# Patient Record
Sex: Male | Born: 1955 | Race: White | Hispanic: No | Marital: Single | Smoking: Former smoker
Health system: Southern US, Community
[De-identification: ages and names within clinical notes are randomized; demographics above are authoritative.]

## PROBLEM LIST (undated history)

## (undated) DIAGNOSIS — K219 Gastro-esophageal reflux disease without esophagitis: Secondary | ICD-10-CM

## (undated) DIAGNOSIS — I10 Essential (primary) hypertension: Secondary | ICD-10-CM

## (undated) DIAGNOSIS — N189 Chronic kidney disease, unspecified: Secondary | ICD-10-CM

## (undated) DIAGNOSIS — D649 Anemia, unspecified: Secondary | ICD-10-CM

## (undated) DIAGNOSIS — M1990 Unspecified osteoarthritis, unspecified site: Secondary | ICD-10-CM

## (undated) HISTORY — DX: Gastro-esophageal reflux disease without esophagitis: K21.9

## (undated) HISTORY — DX: Chronic kidney disease, unspecified: N18.9

## (undated) HISTORY — DX: Anemia, unspecified: D64.9

## (undated) HISTORY — DX: Unspecified osteoarthritis, unspecified site: M19.90

---

## 1975-09-23 HISTORY — PX: HERNIA REPAIR: SHX51

## 2000-08-05 ENCOUNTER — Encounter (INDEPENDENT_AMBULATORY_CARE_PROVIDER_SITE_OTHER): Payer: Self-pay | Admitting: Specialist

## 2000-08-05 ENCOUNTER — Other Ambulatory Visit: Admission: RE | Admit: 2000-08-05 | Discharge: 2000-08-05 | Payer: Self-pay | Admitting: Internal Medicine

## 2005-09-22 HISTORY — PX: KNEE ARTHROSCOPY: SUR90

## 2005-12-05 ENCOUNTER — Ambulatory Visit: Payer: Self-pay | Admitting: Internal Medicine

## 2007-03-23 ENCOUNTER — Ambulatory Visit: Payer: Self-pay | Admitting: Sports Medicine

## 2007-03-23 DIAGNOSIS — S83419A Sprain of medial collateral ligament of unspecified knee, initial encounter: Secondary | ICD-10-CM | POA: Insufficient documentation

## 2007-03-23 DIAGNOSIS — M6250 Muscle wasting and atrophy, not elsewhere classified, unspecified site: Secondary | ICD-10-CM | POA: Insufficient documentation

## 2007-03-23 HISTORY — DX: Sprain of medial collateral ligament of unspecified knee, initial encounter: S83.419A

## 2007-04-05 ENCOUNTER — Telehealth: Payer: Self-pay | Admitting: *Deleted

## 2007-04-05 ENCOUNTER — Ambulatory Visit: Payer: Self-pay | Admitting: Family Medicine

## 2007-04-28 ENCOUNTER — Ambulatory Visit: Payer: Self-pay | Admitting: Family Medicine

## 2007-04-28 ENCOUNTER — Encounter (INDEPENDENT_AMBULATORY_CARE_PROVIDER_SITE_OTHER): Payer: Self-pay | Admitting: Family Medicine

## 2007-04-28 DIAGNOSIS — K219 Gastro-esophageal reflux disease without esophagitis: Secondary | ICD-10-CM

## 2007-04-28 DIAGNOSIS — I10 Essential (primary) hypertension: Secondary | ICD-10-CM

## 2007-04-28 LAB — CONVERTED CEMR LAB
AST: 18 units/L (ref 0–37)
Albumin: 4.5 g/dL (ref 3.5–5.2)
Alkaline Phosphatase: 75 units/L (ref 39–117)
LDL Cholesterol: 107 mg/dL — ABNORMAL HIGH (ref 0–99)
Potassium: 4.4 meq/L (ref 3.5–5.3)
Sodium: 141 meq/L (ref 135–145)
TSH: 2.305 microintl units/mL (ref 0.350–5.50)
Total Bilirubin: 0.4 mg/dL (ref 0.3–1.2)
Total Protein: 6.9 g/dL (ref 6.0–8.3)
VLDL: 20 mg/dL (ref 0–40)

## 2007-04-29 ENCOUNTER — Encounter (INDEPENDENT_AMBULATORY_CARE_PROVIDER_SITE_OTHER): Payer: Self-pay | Admitting: Family Medicine

## 2007-05-03 ENCOUNTER — Telehealth: Payer: Self-pay | Admitting: *Deleted

## 2007-05-04 ENCOUNTER — Ambulatory Visit: Payer: Self-pay | Admitting: Sports Medicine

## 2007-05-18 ENCOUNTER — Encounter (INDEPENDENT_AMBULATORY_CARE_PROVIDER_SITE_OTHER): Payer: Self-pay | Admitting: Family Medicine

## 2007-05-26 DIAGNOSIS — K319 Disease of stomach and duodenum, unspecified: Secondary | ICD-10-CM | POA: Insufficient documentation

## 2007-05-26 DIAGNOSIS — K5730 Diverticulosis of large intestine without perforation or abscess without bleeding: Secondary | ICD-10-CM | POA: Insufficient documentation

## 2007-05-26 DIAGNOSIS — D126 Benign neoplasm of colon, unspecified: Secondary | ICD-10-CM

## 2007-08-05 ENCOUNTER — Encounter (INDEPENDENT_AMBULATORY_CARE_PROVIDER_SITE_OTHER): Payer: Self-pay | Admitting: Family Medicine

## 2007-08-05 LAB — CONVERTED CEMR LAB
HDL goal, serum: 40 mg/dL
LDL Goal: 130 mg/dL

## 2013-01-21 ENCOUNTER — Ambulatory Visit: Payer: Self-pay | Admitting: Internal Medicine

## 2015-01-01 ENCOUNTER — Ambulatory Visit
Admit: 2015-01-01 | Disposition: A | Discharge status: Home / Self Care | Payer: Self-pay | Attending: Internal Medicine | Admitting: Internal Medicine

## 2015-08-19 ENCOUNTER — Encounter: Payer: Self-pay | Admitting: Emergency Medicine

## 2015-08-19 ENCOUNTER — Emergency Department
Admission: EM | Admit: 2015-08-19 | Discharge: 2015-08-19 | Disposition: A | Discharge status: Home / Self Care | Payer: Self-pay | Attending: Emergency Medicine | Admitting: Emergency Medicine

## 2015-08-19 ENCOUNTER — Emergency Department: Payer: Self-pay

## 2015-08-19 DIAGNOSIS — K2970 Gastritis, unspecified, without bleeding: Secondary | ICD-10-CM | POA: Insufficient documentation

## 2015-08-19 DIAGNOSIS — R000 Tachycardia, unspecified: Secondary | ICD-10-CM | POA: Insufficient documentation

## 2015-08-19 DIAGNOSIS — K219 Gastro-esophageal reflux disease without esophagitis: Secondary | ICD-10-CM | POA: Insufficient documentation

## 2015-08-19 DIAGNOSIS — I10 Essential (primary) hypertension: Secondary | ICD-10-CM | POA: Insufficient documentation

## 2015-08-19 DIAGNOSIS — F419 Anxiety disorder, unspecified: Secondary | ICD-10-CM | POA: Insufficient documentation

## 2015-08-19 DIAGNOSIS — R1010 Upper abdominal pain, unspecified: Secondary | ICD-10-CM

## 2015-08-19 HISTORY — DX: Essential (primary) hypertension: I10

## 2015-08-19 LAB — CBC WITH DIFFERENTIAL/PLATELET
BASOS PCT: 0 %
Basophils Absolute: 0 10*3/uL (ref 0–0.1)
Eosinophils Absolute: 0 10*3/uL (ref 0–0.7)
Eosinophils Relative: 0 %
HEMATOCRIT: 49.6 % (ref 40.0–52.0)
Hemoglobin: 16.3 g/dL (ref 13.0–18.0)
Lymphocytes Relative: 11 %
Lymphs Abs: 1.1 10*3/uL (ref 1.0–3.6)
MCH: 29.2 pg (ref 26.0–34.0)
MCHC: 33 g/dL (ref 32.0–36.0)
MCV: 88.7 fL (ref 80.0–100.0)
MONO ABS: 0.8 10*3/uL (ref 0.2–1.0)
MONOS PCT: 8 %
NEUTROS ABS: 7.6 10*3/uL — AB (ref 1.4–6.5)
Neutrophils Relative %: 81 %
Platelets: 272 10*3/uL (ref 150–440)
RBC: 5.59 MIL/uL (ref 4.40–5.90)
RDW: 13.7 % (ref 11.5–14.5)
WBC: 9.4 10*3/uL (ref 3.8–10.6)

## 2015-08-19 LAB — URINALYSIS COMPLETE WITH MICROSCOPIC (ARMC ONLY)
BACTERIA UA: NONE SEEN
Bilirubin Urine: NEGATIVE
Glucose, UA: NEGATIVE mg/dL
Hgb urine dipstick: NEGATIVE
KETONES UR: NEGATIVE mg/dL
LEUKOCYTES UA: NEGATIVE
NITRITE: NEGATIVE
PH: 6 (ref 5.0–8.0)
PROTEIN: NEGATIVE mg/dL
RBC / HPF: NONE SEEN RBC/hpf (ref 0–5)
SPECIFIC GRAVITY, URINE: 1.021 (ref 1.005–1.030)
SQUAMOUS EPITHELIAL / LPF: NONE SEEN

## 2015-08-19 LAB — LIPASE, BLOOD: LIPASE: 22 U/L (ref 11–51)

## 2015-08-19 LAB — COMPREHENSIVE METABOLIC PANEL
ALT: 26 U/L (ref 17–63)
ANION GAP: 8 (ref 5–15)
AST: 26 U/L (ref 15–41)
Albumin: 4.6 g/dL (ref 3.5–5.0)
Alkaline Phosphatase: 60 U/L (ref 38–126)
BUN: 19 mg/dL (ref 6–20)
CHLORIDE: 103 mmol/L (ref 101–111)
CO2: 27 mmol/L (ref 22–32)
Calcium: 9.4 mg/dL (ref 8.9–10.3)
Creatinine, Ser: 0.8 mg/dL (ref 0.61–1.24)
Glucose, Bld: 114 mg/dL — ABNORMAL HIGH (ref 65–99)
POTASSIUM: 3.5 mmol/L (ref 3.5–5.1)
Sodium: 138 mmol/L (ref 135–145)
Total Bilirubin: 0.8 mg/dL (ref 0.3–1.2)
Total Protein: 7.8 g/dL (ref 6.5–8.1)

## 2015-08-19 LAB — TROPONIN I

## 2015-08-19 MED ORDER — MORPHINE SULFATE (PF) 4 MG/ML IV SOLN
4.0000 mg | Freq: Once | INTRAVENOUS | Status: AC
  Administered 2015-08-19: 4 mg via INTRAVENOUS

## 2015-08-19 MED ORDER — MORPHINE SULFATE (PF) 4 MG/ML IV SOLN
4.0000 mg | Freq: Once | INTRAVENOUS | Status: AC
  Administered 2015-08-19: 4 mg via INTRAVENOUS
  Filled 2015-08-19: qty 1

## 2015-08-19 MED ORDER — FAMOTIDINE 20 MG PO TABS
40.0000 mg | ORAL_TABLET | Freq: Once | ORAL | Status: AC
  Administered 2015-08-19: 40 mg via ORAL
  Filled 2015-08-19: qty 2

## 2015-08-19 MED ORDER — METOCLOPRAMIDE HCL 10 MG PO TABS: 10.0000 mg | ORAL_TABLET | Freq: Three times a day (TID) | ORAL | Status: DC

## 2015-08-19 MED ORDER — DIPHENHYDRAMINE HCL 50 MG/ML IJ SOLN
25.0000 mg | Freq: Once | INTRAMUSCULAR | Status: AC
  Administered 2015-08-19: 25 mg via INTRAVENOUS
  Filled 2015-08-19: qty 1

## 2015-08-19 MED ORDER — ONDANSETRON HCL 4 MG/2ML IJ SOLN
4.0000 mg | Freq: Once | INTRAMUSCULAR | Status: AC
  Administered 2015-08-19: 4 mg via INTRAVENOUS

## 2015-08-19 MED ORDER — GI COCKTAIL ~~LOC~~
30.0000 mL | ORAL | Status: AC
  Administered 2015-08-19: 30 mL via ORAL
  Filled 2015-08-19: qty 30

## 2015-08-19 MED ORDER — IOHEXOL 240 MG/ML SOLN
25.0000 mL | Freq: Once | INTRAMUSCULAR | Status: AC | PRN
  Administered 2015-08-19: 25 mL via ORAL

## 2015-08-19 MED ORDER — IOHEXOL 350 MG/ML SOLN
100.0000 mL | Freq: Once | INTRAVENOUS | Status: AC | PRN
  Administered 2015-08-19: 100 mL via INTRAVENOUS

## 2015-08-19 MED ORDER — MORPHINE SULFATE (PF) 4 MG/ML IV SOLN
INTRAVENOUS | Status: AC
  Administered 2015-08-19: 4 mg via INTRAVENOUS
  Filled 2015-08-19: qty 1

## 2015-08-19 MED ORDER — ONDANSETRON HCL 4 MG/2ML IJ SOLN
INTRAMUSCULAR | Status: AC
  Administered 2015-08-19: 4 mg via INTRAVENOUS
  Filled 2015-08-19: qty 2

## 2015-08-19 MED ORDER — METOCLOPRAMIDE HCL 5 MG/ML IJ SOLN
10.0000 mg | Freq: Once | INTRAMUSCULAR | Status: AC
  Administered 2015-08-19: 10 mg via INTRAVENOUS
  Filled 2015-08-19: qty 2

## 2015-08-19 MED ORDER — DIPHENHYDRAMINE HCL 25 MG PO CAPS: 50.0000 mg | ORAL_CAPSULE | Freq: Four times a day (QID) | ORAL | Status: DC | PRN

## 2015-08-19 MED ORDER — PB-HYOSCY-ATROPINE-SCOPOLAMINE 16.2 MG PO TABS: 1.0000 | ORAL_TABLET | ORAL | Status: DC | PRN

## 2015-08-19 MED ORDER — ONDANSETRON HCL 4 MG/2ML IJ SOLN
4.0000 mg | Freq: Once | INTRAMUSCULAR | Status: AC
Start: 2015-08-19 — End: 2015-08-19
  Administered 2015-08-19: 4 mg via INTRAVENOUS
  Filled 2015-08-19: qty 2

## 2015-08-19 MED ORDER — ONDANSETRON HCL 4 MG/2ML IJ SOLN
4.0000 mg | Freq: Once | INTRAMUSCULAR | Status: AC
  Administered 2015-08-19: 4 mg via INTRAVENOUS
  Filled 2015-08-19: qty 2

## 2015-08-19 MED ORDER — RANITIDINE HCL 150 MG PO CAPS: 300.0000 mg | ORAL_CAPSULE | Freq: Two times a day (BID) | ORAL | Status: DC

## 2015-08-19 MED ORDER — SUCRALFATE 1 G PO TABS: 1.0000 g | ORAL_TABLET | Freq: Four times a day (QID) | ORAL | Status: DC

## 2015-08-19 NOTE — ED Provider Notes (Signed)
[facility] Emergency Department Provider Note  ____________________________________________  Time seen: 1:30 PM  I have reviewed the triage vital signs and the nursing notes.   HISTORY  Chief Complaint Abdominal Pain    HPI Keith Ramos is a 59 y.o. male who presents with complaints of nausea vomiting and abdominal pain for approximately 3 days. He thought it was related to eating a heavy meal for Thanksgiving but the symptoms have not improved. He reports he had similar symptoms about a month ago after heavy meal. He reports the pain is essentially diffuse in his abdomen but slightly worse in the epigastrium. He denies chest pain. No fevers chills. No shortness of breath. No history of abdominal surgery besides a hernia repair. Eating makes the pain much worse. The pain is crampy and moderate in nature     History reviewed. No pertinent past medical history.  Patient Active Problem List   Diagnosis Date Noted  . COLONIC POLYPS 05/26/2007  . PEPTIC STRICTURE 05/26/2007  . DIVERTICULOSIS, COLON W/O HEM 05/26/2007  . HYPERTENSION, BENIGN ESSENTIAL, LABILE 04/28/2007  . REFLUX, ESOPHAGEAL 04/28/2007  . 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 KNEE SPRAIN, MEDIAL COLLATERAL LIGAMENT 03/23/2007    Past Surgical History  Procedure Laterality Date  . Hernia repair      No current outpatient prescriptions on file.  Allergies Review of patient's allergies indicates no known allergies.  No family history on file.  Social History Social History  Substance Use Topics  . Smoking status: Never Smoker   . Smokeless tobacco: None  . Alcohol Use: Yes    Review of Systems  Constitutional: Negative for fever. Eyes: Negative for visual changes. ENT: Negative for sore throat Cardiovascular: Negative for chest pain. Respiratory: Negative for shortness of breath. Gastrointestinal: Positive for abdominal pain, nausea vomiting, no diarrhea Genitourinary:  Negative for dysuria. Musculoskeletal: Negative for back pain. Skin: Negative for rash. Neurological: Negative for headaches or focal weakness Psychiatric: Mild anxiety  ____________________________________________   PHYSICAL EXAM:  VITAL SIGNS: ED Triage Vitals  Enc Vitals Group     BP 08/19/15 1256 195/103 mmHg     Pulse Rate 08/19/15 1256 105     Resp 08/19/15 1256 20     Temp 08/19/15 1256 98.5 F (36.9 C)     Temp Source 08/19/15 1256 Oral     SpO2 08/19/15 1256 99 %     Weight 08/19/15 1256 158 lb (71.668 kg)     Height 08/19/15 1256 5\' 7"  (1.702 m)     Head Cir --      Peak Flow --      Pain Score 08/19/15 1257 8     Pain Loc --      Pain Edu? --      Excl. in [place]? --      Constitutional: Alert and oriented. Well appearing and in no distress. Eyes: Conjunctivae are normal.  ENT   Head: Normocephalic and atraumatic.   Mouth/Throat: Mucous membranes are moist. Cardiovascular: Tachycardia, regular rhythm. Normal and symmetric distal pulses are present in all extremities. No murmurs, rubs, or gallops. Respiratory: Normal respiratory effort without tachypnea nor retractions. Breath sounds are clear and equal bilaterally.  Gastrointestinal: Mild tenderness to palpation throughout the abdomen but worse in the right upper quadrant. No distention. There is no CVA tenderness. Genitourinary: deferred Musculoskeletal: Nontender with normal range of motion in all extremities. No lower extremity tenderness nor edema. Neurologic:  Normal speech and language. No gross focal neurologic  deficits are appreciated. Skin:  Skin is warm, dry and intact. No rash noted. Psychiatric: Mood and affect are normal. Patient exhibits appropriate insight and judgment.  ____________________________________________    LABS (pertinent positives/negatives)  Labs Reviewed  CBC WITH DIFFERENTIAL/PLATELET - Abnormal; Notable for the following:    Neutro Abs 7.6 (*)    All other components  within normal limits  URINALYSIS COMPLETEWITH MICROSCOPIC ([facility] ONLY) - Abnormal; Notable for the following:    Color, Urine YELLOW (*)    APPearance CLEAR (*)    All other components within normal limits  COMPREHENSIVE METABOLIC PANEL  LIPASE, BLOOD    ____________________________________________   EKG  ED ECG REPORT I, Lavonia Drafts, the attending physician, personally viewed and interpreted this ECG.   Date: 08/19/2015  EKG Time: 3:03 PM  Rate: 83  Rhythm: normal sinus rhythm  Axis: Normal axis  Intervals:none  ST&T Change: Nonspecific ST depression   ____________________________________________    RADIOLOGY I have personally reviewed any xrays that were ordered on this patient: Ultrasound gallbladder shows no acute cholecystis  ____________________________________________   PROCEDURES  Procedure(s) performed: none   Critical Care performed: none ____________________________________________   INITIAL IMPRESSION / ASSESSMENT AND PLAN / ED COURSE  Pertinent labs & imaging results that were available during my care of the patient were reviewed by me and considered in my medical decision making (see chart for details).  Patient presents with significant discomfort and nausea. His abdominal pain is fairly diffuse but my exam it seems to be more in the right upper quadrant. We will start with an ultrasound of the gallbladder, give morphine IV and Zofran IV and reevaluate  ----------------------------------------- 3:00 PM on 08/19/2015 -----------------------------------------  Ultrasound negative for cholecystitis. Patient is to have pain. We will give more morphine and Zofran and I will order CT abdomen and the pelvis. He is also complaining of chest tightness that started now, i will order an EKG and a troponin.  Thus far patient's labs are reassuring.  I will sign out care of the patient to Dr. Joni Fears. I've asked him to follow up on the remaining labs  and CT scan.  ____________________________________________   FINAL CLINICAL IMPRESSION(S) / ED DIAGNOSES  Final diagnoses:  Pain of upper abdomen     Lavonia Drafts, MD 08/19/15 1506

## 2015-08-19 NOTE — ED Notes (Signed)
Called pts sister Berdine Addison to update her on pts status.

## 2015-08-19 NOTE — Discharge Instructions (Signed)
Abdominal Pain, Adult °Many things can cause abdominal pain. Usually, abdominal pain is not caused by a disease and will improve without treatment. It can often be observed and treated at home. Your health care provider will do a physical exam and possibly order blood tests and X-rays to help determine the seriousness of your pain. However, in many cases, more time must pass before a clear cause of the pain can be found. Before that point, your health care provider may not know if you need more testing or further treatment. °HOME CARE INSTRUCTIONS °Monitor your abdominal pain for any changes. The following actions may help to alleviate any discomfort you are experiencing: °· Only take over-the-counter or prescription medicines as directed by your health care provider. °· Do not take laxatives unless directed to do so by your health care provider. °· Try a clear liquid diet (broth, tea, or water) as directed by your health care provider. Slowly move to a bland diet as tolerated. °SEEK MEDICAL CARE IF: °· You have unexplained abdominal pain. °· You have abdominal pain associated with nausea or diarrhea. °· You have pain when you urinate or have a bowel movement. °· You experience abdominal pain that wakes you in the night. °· You have abdominal pain that is worsened or improved by eating food. °· You have abdominal pain that is worsened with eating fatty foods. °· You have a fever. °SEEK IMMEDIATE MEDICAL CARE IF: °· Your pain does not go away within 2 hours. °· You keep throwing up (vomiting). °· Your pain is felt only in portions of the abdomen, such as the right side or the left lower portion of the abdomen. °· You pass bloody or black tarry stools. °MAKE SURE YOU: °· Understand these instructions. °· Will watch your condition. °· Will get help right away if you are not doing well or get worse. °  °This information is not intended to replace advice given to you by your health care provider. Make sure you discuss  any questions you have with your health care provider. °  °Document Released: 06/18/2005 Document Revised: 05/30/2015 Document Reviewed: 05/18/2013 °Elsevier Interactive Patient Education ©2016 Elsevier Inc[employer] ° °Gastritis, Adult °Gastritis is soreness and swelling (inflammation) of the lining of the stomach. Gastritis can develop as a sudden onset (acute) or long-term (chronic) condition. If gastritis is not treated, it can lead to stomach bleeding and ulcers. °CAUSES  °Gastritis occurs when the stomach lining is weak or damaged. Digestive juices from the stomach then inflame the weakened stomach lining. The stomach lining may be weak or damaged due to viral or bacterial infections. One common bacterial infection is the Helicobacter pylori infection. Gastritis can also result from excessive alcohol consumption, taking certain medicines, or having too much acid in the stomach.  °SYMPTOMS  °In some cases, there are no symptoms. When symptoms are present, they may include: °· Pain or a burning sensation in the upper abdomen. °· Nausea. °· Vomiting. °· An uncomfortable feeling of fullness after eating. °DIAGNOSIS  °Your caregiver may suspect you have gastritis based on your symptoms and a physical exam. To determine the cause of your gastritis, your caregiver may perform the following: °· Blood or stool tests to check for the H pylori bacterium. °· Gastroscopy. A thin, flexible tube (endoscope) is passed down the esophagus and into the stomach. The endoscope has a light and camera on the end. Your caregiver uses the endoscope to view the inside of the stomach. °· Taking a tissue sample (  biopsy) from the stomach to examine under a microscope. TREATMENT  Depending on the cause of your gastritis, medicines may be prescribed. If you have a bacterial infection, such as an H pylori infection, antibiotics may be given. If your gastritis is caused by too much acid in the stomach, H2 blockers or antacids may be given. Your  caregiver may recommend that you stop taking aspirin, ibuprofen, or other nonsteroidal anti-inflammatory drugs (NSAIDs). HOME CARE INSTRUCTIONS  Only take over-the-counter or prescription medicines as directed by your caregiver.  If you were given antibiotic medicines, take them as directed. Finish them even if you start to feel better.  Drink enough fluids to keep your urine clear or pale yellow.  Avoid foods and drinks that make your symptoms worse, such as:  Caffeine or alcoholic drinks.  Chocolate.  Peppermint or mint flavorings.  Garlic and onions.  Spicy foods.  Citrus fruits, such as oranges, lemons, or limes.  Tomato-based foods such as sauce, chili, salsa, and pizza.  Fried and fatty foods.  Eat small, frequent meals instead of large meals. SEEK IMMEDIATE MEDICAL CARE IF:   You have black or dark red stools.  You vomit blood or material that looks like coffee grounds.  You are unable to keep fluids down.  Your abdominal pain gets worse.  You have a fever.  You do not feel better after 1 week.  You have any other questions or concerns. MAKE SURE YOU:  Understand these instructions.  Will watch your condition.  Will get help right away if you are not doing well or get worse.   This information is not intended to replace advice given to you by your health care provider. Make sure you discuss any questions you have with your health care provider.   Document Released: 09/02/2001 Document Revised: 03/09/2012 Document Reviewed: 10/22/2011 Elsevier Interactive Patient Education 2016 [place] for Gastroesophageal Reflux Disease, Adult When you have gastroesophageal reflux disease (GERD), the foods you eat and your eating habits are very important. Choosing the right foods can help ease your discomfort.  WHAT GUIDELINES DO I NEED TO FOLLOW?   Choose fruits, vegetables, whole grains, and low-fat dairy products.   Choose low-fat meat,  fish, and poultry.  Limit fats such as oils, salad dressings, butter, nuts, and avocado.   Keep a food diary. This helps you identify foods that cause symptoms.   Avoid foods that cause symptoms. These may be different for everyone.   Eat small meals often instead of 3 large meals a day.   Eat your meals slowly, in a place where you are relaxed.   Limit fried foods.   Cook foods using methods other than frying.   Avoid drinking alcohol.   Avoid drinking large amounts of liquids with your meals.   Avoid bending over or lying down until 2-3 hours after eating.  WHAT FOODS ARE NOT RECOMMENDED?  These are some foods and drinks that may make your symptoms worse: Vegetables Tomatoes. Tomato juice. Tomato and spaghetti sauce. Chili peppers. Onion and garlic. Horseradish. Fruits Oranges, grapefruit, and lemon (fruit and juice). Meats High-fat meats, fish, and poultry. This includes hot dogs, ribs, ham, sausage, salami, and bacon. Dairy Whole milk and chocolate milk. Sour cream. Cream. Butter. Ice cream. Cream cheese.  Drinks Coffee and tea. Bubbly (carbonated) drinks or energy drinks. Condiments Hot sauce. Barbecue sauce.  Sweets/Desserts Chocolate and cocoa. Donuts. Peppermint and spearmint. Fats and Oils High-fat foods. This includes [place] fries and potato  chips. Other Vinegar. Strong spices. This includes black pepper, white pepper, red pepper, cayenne, curry powder, cloves, ginger, and chili powder. The items listed above may not be a complete list of foods and drinks to avoid. Contact your dietitian for more information.   This information is not intended to replace advice given to you by your health care provider. Make sure you discuss any questions you have with your health care provider.   Document Released: 03/09/2012 Document Revised: 09/29/2014 Document Reviewed: 07/13/2013 Elsevier Interactive Patient Education 2016 [place].  Gastroesophageal  Reflux Disease, Adult Normally, food travels down the esophagus and stays in the stomach to be digested. However, when a person has gastroesophageal reflux disease (GERD), food and stomach acid move back up into the esophagus. When this happens, the esophagus becomes sore and inflamed. Over time, GERD can create small holes (ulcers) in the lining of the esophagus.  CAUSES This condition is caused by a problem with the muscle between the esophagus and the stomach (lower esophageal sphincter, or LES). Normally, the LES muscle closes after food passes through the esophagus to the stomach. When the LES is weakened or abnormal, it does not close properly, and that allows food and stomach acid to go back up into the esophagus. The LES can be weakened by certain dietary substances, medicines, and medical conditions, including:  Tobacco use.  Pregnancy.  Having a hiatal hernia.  Heavy alcohol use.  Certain foods and beverages, such as coffee, chocolate, onions, and peppermint. RISK FACTORS This condition is more likely to develop in:  People who have an increased body weight.  People who have connective tissue disorders.  People who use NSAID medicines. SYMPTOMS Symptoms of this condition include:  Heartburn.  Difficult or painful swallowing.  The feeling of having a lump in the throat.  Abitter taste in the mouth.  Bad breath.  Having a large amount of saliva.  Having an upset or bloated stomach.  Belching.  Chest pain.  Shortness of breath or wheezing.  Ongoing (chronic) cough or a night-time cough.  Wearing away of tooth enamel.  Weight loss. Different conditions can cause chest pain. Make sure to see your health care provider if you experience chest pain. DIAGNOSIS Your health care provider will take a medical history and perform a physical exam. To determine if you have mild or severe GERD, your health care provider may also monitor how you respond to treatment. You  may also have other tests, including:  An endoscopy toexamine your stomach and esophagus with a small camera.  A test thatmeasures the acidity level in your esophagus.  A test thatmeasures how much pressure is on your esophagus.  A barium swallow or modified barium swallow to show the shape, size, and functioning of your esophagus. TREATMENT The goal of treatment is to help relieve your symptoms and to prevent complications. Treatment for this condition may vary depending on how severe your symptoms are. Your health care provider may recommend:  Changes to your diet.  Medicine.  Surgery. HOME CARE INSTRUCTIONS Diet  Follow a diet as recommended by your health care provider. This may involve avoiding foods and drinks such as:  Coffee and tea (with or without caffeine).  Drinks that containalcohol.  Energy drinks and sports drinks.  Carbonated drinks or sodas.  Chocolate and cocoa.  Peppermint and mint flavorings.  Garlic and onions.  Horseradish.  Spicy and acidic foods, including peppers, chili powder, curry powder, vinegar, hot sauces, and barbecue sauce.  Citrus  fruit juices and citrus fruits, such as oranges, lemons, and limes.  Tomato-based foods, such as red sauce, chili, salsa, and pizza with red sauce.  Fried and fatty foods, such as donuts, french fries, potato chips, and high-fat dressings.  High-fat meats, such as hot dogs and fatty cuts of red and white meats, such as rib eye steak, sausage, ham, and bacon.  High-fat dairy items, such as whole milk, butter, and cream cheese.  Eat small, frequent meals instead of large meals.  Avoid drinking large amounts of liquid with your meals.  Avoid eating meals during the 2-3 hours before bedtime.  Avoid lying down right after you eat.  Do not exercise right after you eat. General Instructions  Pay attention to any changes in your symptoms.  Take over-the-counter and prescription medicines only as  told by your health care provider. Do not take aspirin, ibuprofen, or other NSAIDs unless your health care provider told you to do so.  Do not use any tobacco products, including cigarettes, chewing tobacco, and e-cigarettes. If you need help quitting, ask your health care provider.  Wear loose-fitting clothing. Do not wear anything tight around your waist that causes pressure on your abdomen.  Raise (elevate) the head of your bed 6 inches (15cm).  Try to reduce your stress, such as with yoga or meditation. If you need help reducing stress, ask your health care provider.  If you are overweight, reduce your weight to an amount that is healthy for you. Ask your health care provider for guidance about a safe weight loss goal.  Keep all follow-up visits as told by your health care provider. This is important. SEEK MEDICAL CARE IF:  You have new symptoms.  You have unexplained weight loss.  You have difficulty swallowing, or it hurts to swallow.  You have wheezing or a persistent cough.  Your symptoms do not improve with treatment.  You have a hoarse voice. SEEK IMMEDIATE MEDICAL CARE IF:  You have pain in your arms, neck, jaw, teeth, or back.  You feel sweaty, dizzy, or light-headed.  You have chest pain or shortness of breath.  You vomit and your vomit looks like blood or coffee grounds.  You faint.  Your stool is bloody or black.  You cannot swallow, drink, or eat.   This information is not intended to replace advice given to you by your health care provider. Make sure you discuss any questions you have with your health care provider.   Document Released: 06/18/2005 Document Revised: 05/30/2015 Document Reviewed: 01/03/2015 Elsevier Interactive Patient Education [employer].

## 2015-08-19 NOTE — ED Notes (Signed)
Patient reports tolerating po's at this time.  Patient instructed he is ready for discharge and needs to call for a ride.

## 2015-08-19 NOTE — ED Notes (Signed)
Nausea, vomiting and abdominal pain x 3 days.

## 2015-08-19 NOTE — ED Provider Notes (Signed)
-----------------------------------------   7:06 PM on 08/19/2015 -----------------------------------------  Vital signs remained stable, patient continues to complain of epigastric pain. Extensive workup is actually unremarkable including labs ultrasound and CT. At this point given the association with a heavy meal the persistence and the negative workup, have a high suspicion for gastritis or possibly peptic ulcer. Due to the severity of his symptoms, we will give the patient a aggressive antiacid regimen including Carafate and Zantac Reglan and Benadryl and Donnatal and have him follow up with GI. Low suspicion for obstruction perforation cholangitis or AAA, pancreatitis or cardiopulmonary pathology.  Carrie Mew, MD 08/19/15 [phone]

## 2015-08-27 ENCOUNTER — Ambulatory Visit: Payer: Self-pay | Admitting: Gastroenterology

## 2015-08-27 ENCOUNTER — Encounter: Payer: Self-pay | Admitting: Gastroenterology

## 2015-08-27 ENCOUNTER — Ambulatory Visit (INDEPENDENT_AMBULATORY_CARE_PROVIDER_SITE_OTHER): Payer: Self-pay | Admitting: Gastroenterology

## 2015-08-27 VITALS — BP 179/89 | HR 83 | Temp 99.0°F | Ht 67.5 in | Wt 156.0 lb

## 2015-08-27 DIAGNOSIS — R112 Nausea with vomiting, unspecified: Secondary | ICD-10-CM

## 2015-08-27 DIAGNOSIS — R197 Diarrhea, unspecified: Secondary | ICD-10-CM

## 2015-08-27 NOTE — Progress Notes (Signed)
Gastroenterology Consultation  Referring Provider:     Albina Billet, MD Primary Care Physician:  Albina Billet, MD Primary Gastroenterologist:  Dr. Allen Norris     Reason for Consultation:     Nausea vomiting fevers chills and diarrhea        HPI:   Keith Ramos is a 59 y.o. y/o male referred for consultation & management of nausea vomiting fevers chills and diarrhea by Dr. Albina Billet, MD.  This patient reports going to emergency room after having an episode after Thanksgiving with nausea and vomiting fevers or chills. The patient states he was deathly running a fever and had diarrhea. The patient had vomiting all that day and then woke up in the morning had some coffee and started vomiting again. The patient went to the emergency room and had a CT scan of the abdomen and an ultrasound that showed some sludge but no other cause for his nausea vomiting fevers or chills. The patient states that it went away readily and he has not had any symptoms. The patient was put on multiple medications including Reglan Donnatal and Carafate. He was already taking a PPI but was switched to Zantac. He would like to know if he can stop taking most of his medications since he is feeling fine now. The patient has no history of black stools or bloody stools. The patient states that he has a colonoscopy 5 years because of colon polyps. The patient's last colonoscopy was 1 year ago for he moved to the area.  Past Medical History  Diagnosis Date  . Hypertension     Past Surgical History  Procedure Laterality Date  . Hernia repair      Prior to Admission medications   Medication Sig Start Date End Date Taking? Authorizing Provider  diphenhydrAMINE (BENADRYL) 25 mg capsule Take 2 capsules (50 mg total) by mouth every 6 (six) hours as needed. 08/19/15  Yes Carrie Mew, MD  metoCLOPramide (REGLAN) 10 MG tablet Take 1 tablet (10 mg total) by mouth 4 (four) times daily -  before meals and at bedtime. 08/19/15  Yes  Carrie Mew, MD  ranitidine (ZANTAC) 150 MG capsule Take 2 capsules (300 mg total) by mouth 2 (two) times daily. 08/19/15  Yes Carrie Mew, MD  sucralfate (CARAFATE) 1 G tablet Take 1 tablet (1 g total) by mouth 4 (four) times daily. 08/19/15  Yes Carrie Mew, MD  [place] alk-PHENObarbital [facility]) 16.2 MG tablet Take 1 tablet by mouth as needed. Patient not taking: Reported on 08/27/2015 08/19/15   Carrie Mew, MD    Family History  Problem Relation Age of Onset  . Hepatitis B Father   . Cirrhosis Father   . Colon cancer Maternal Grandfather      Social History  Substance Use Topics  . Smoking status: Former Research scientist (life sciences)  . Smokeless tobacco: Never Used  . Alcohol Use: Yes    Allergies as of 08/27/2015  . (No Known Allergies)    Review of Systems:    All systems reviewed and negative except where noted in HPI.   Physical Exam:  BP 179/89 mmHg  Pulse 83  Temp(Src) 99 F (37.2 C) (Oral)  Ht 5' 7.5" (1.715 m)  Wt 156 lb (70.761 kg)  BMI 24.06 kg/m2 No LMP for male patient. Psych:  Alert and cooperative. Normal mood and affect. General:   Alert,  Well-developed, well-nourished, pleasant and cooperative in NAD Head:  Normocephalic and atraumatic. Eyes:  Sclera clear, no icterus.  Conjunctiva pink. Ears:  Normal auditory acuity. Nose:  No deformity, discharge, or lesions. Mouth:  No deformity or lesions,oropharynx pink & moist. Neck:  Supple; no masses or thyromegaly. Lungs:  Respirations even and unlabored.  Clear throughout to auscultation.   No wheezes, crackles, or rhonchi. No acute distress. Heart:  Regular rate and rhythm; no murmurs, clicks, rubs, or gallops. Abdomen:  Normal bowel sounds.  No bruits.  Soft, non-tender and non-distended without masses, hepatosplenomegaly or hernias noted.  No guarding or rebound tenderness.  Negative Carnett sign.   Rectal:  Deferred.  Msk:  Symmetrical without gross deformities.  Good, equal movement & strength  bilaterally. Pulses:  Normal pulses noted. Extremities:  No clubbing or edema.  No cyanosis. Neurologic:  Alert and oriented x3;  grossly normal neurologically. Skin:  Intact without significant lesions or rashes.  No jaundice. Lymph Nodes:  No significant cervical adenopathy. Psych:  Alert and cooperative. Normal mood and affect.  Imaging Studies: Ct Abdomen Pelvis W Contrast  08/19/2015  CLINICAL DATA:  Nausea, vomiting, abdominal pain for 3 days EXAM: CT ABDOMEN AND PELVIS WITH CONTRAST TECHNIQUE: Multidetector CT imaging of the abdomen and pelvis was performed using the standard protocol following bolus administration of intravenous contrast. CONTRAST:  163m OMNIPAQUE IOHEXOL 350 MG/ML SOLN COMPARISON:  None. FINDINGS: Sagittal images of the spine are unremarkable. The lung bases are unremarkable. No calcified gallstones are noted within gallbladder. No pericholecystic fluid. No focal hepatic mass. The pancreas, spleen and adrenal glands are unremarkable. Atherosclerotic calcifications of distal abdominal aorta. No aortic aneurysm. Kidneys are symmetrical in size and enhancement. No hydronephrosis or hydroureter. There is nonobstructive calcified calculus in upper pole of the right kidney measures 2.6 mm. Delayed renal images shows bilateral renal symmetrical excretion. Bilateral visualized proximal ureter is unremarkable. No small bowel obstruction. Diffuse colonic diverticulosis. No evidence of acute diverticulitis. No pericecal inflammation. Normal appendix is noted in coronal image 45. No ascites or free air. No adenopathy. Prostate gland and seminal vesicles are unremarkable. Surgical clips are noted bilateral scrotal canal. No destructive bony lesions are noted within pelvis. A tiny calcification is noted within prostate gland. No inguinal adenopathy. No calcified calculi are noted within urinary bladder. IMPRESSION: 1. There is no evidence of acute inflammatory process within abdomen. 2.  Diffuse colonic diverticulosis. No evidence of acute diverticulitis. 3. Normal appendix.  No pericecal inflammation. 4. Right nonobstructive nephrolithiasis. No hydronephrosis or hydroureter. 5. No small bowel obstruction. Electronically Signed   By: LLahoma CrockerM.D.   On: 08/19/2015 16:22   [place] Limited Ru[place]  08/19/2015  CLINICAL DATA:  Upper abdominal pain and vomiting for 3 days. Initial encounter. EXAM: [place] LIMITED - RIGHT UPPER QUADRANT COMPARISON:  None. FINDINGS: Gallbladder: A small amount sludge is seen within the gallbladder. No gallstones, gallbladder wall thickening or pericholecystic fluid is identified. Sonographer reports negative Murphy's sign. Common bile duct: Diameter: 0.4 cm Liver: No focal lesion identified. Within normal limits in parenchymal echogenicity. IMPRESSION: Small amount of gallbladder sludge without evidence of cholecystitis or gallstones. The examination is otherwise negative. Electronically Signed   By: TInge RiseM.D.   On: 08/19/2015 14:25    Assessment and Plan:   Keith BONGis a 59y.o. y/o male who was in the emergency department with fevers chills nausea and vomiting. The patient had 2 episodes of this once right after Thanksgiving and one month prior. Both episodes were after eating a large amount of food. The patient had a negative  workup in the ER with a normal white cell count and normal CT and ultrasound. The patient has been told since the nausea and vomiting came with diarrhea and fevers and chills this was likely a viral or bacterial infection. The patient has had no further symptoms since that time. The patient has been told to contact us if he has any further GI problems. We'll also need a repeat colonoscopy in 4 years.   Note: This dictation was prepared with Dragon dictation along with smaller phrase technology. Any transcriptional errors that result from this process are unintentional.

## 2015-09-25 ENCOUNTER — Ambulatory Visit: Payer: Self-pay | Admitting: Gastroenterology

## 2017-10-25 ENCOUNTER — Emergency Department (HOSPITAL_COMMUNITY)
Admission: EM | Admit: 2017-10-25 | Discharge: 2017-10-25 | Disposition: A | Discharge status: Home / Self Care | Payer: Self-pay | Attending: Emergency Medicine | Admitting: Emergency Medicine

## 2017-10-25 ENCOUNTER — Emergency Department (HOSPITAL_COMMUNITY): Payer: Self-pay

## 2017-10-25 ENCOUNTER — Encounter (HOSPITAL_COMMUNITY): Payer: Self-pay

## 2017-10-25 DIAGNOSIS — W208XXA Other cause of strike by thrown, projected or falling object, initial encounter: Secondary | ICD-10-CM | POA: Insufficient documentation

## 2017-10-25 DIAGNOSIS — S60221A Contusion of right hand, initial encounter: Secondary | ICD-10-CM | POA: Insufficient documentation

## 2017-10-25 DIAGNOSIS — Y929 Unspecified place or not applicable: Secondary | ICD-10-CM | POA: Insufficient documentation

## 2017-10-25 DIAGNOSIS — Y998 Other external cause status: Secondary | ICD-10-CM | POA: Insufficient documentation

## 2017-10-25 DIAGNOSIS — S46912A Strain of unspecified muscle, fascia and tendon at shoulder and upper arm level, left arm, initial encounter: Secondary | ICD-10-CM | POA: Insufficient documentation

## 2017-10-25 DIAGNOSIS — S61412A Laceration without foreign body of left hand, initial encounter: Secondary | ICD-10-CM | POA: Insufficient documentation

## 2017-10-25 DIAGNOSIS — S39012A Strain of muscle, fascia and tendon of lower back, initial encounter: Secondary | ICD-10-CM | POA: Insufficient documentation

## 2017-10-25 DIAGNOSIS — S60222A Contusion of left hand, initial encounter: Secondary | ICD-10-CM | POA: Insufficient documentation

## 2017-10-25 DIAGNOSIS — S61411A Laceration without foreign body of right hand, initial encounter: Secondary | ICD-10-CM | POA: Insufficient documentation

## 2017-10-25 DIAGNOSIS — I10 Essential (primary) hypertension: Secondary | ICD-10-CM | POA: Insufficient documentation

## 2017-10-25 DIAGNOSIS — Y9389 Activity, other specified: Secondary | ICD-10-CM | POA: Insufficient documentation

## 2017-10-25 DIAGNOSIS — Z87891 Personal history of nicotine dependence: Secondary | ICD-10-CM | POA: Insufficient documentation

## 2017-10-25 DIAGNOSIS — Z79899 Other long term (current) drug therapy: Secondary | ICD-10-CM | POA: Insufficient documentation

## 2017-10-25 MED ORDER — OXYCODONE-ACETAMINOPHEN 5-325 MG PO TABS
1.0000 | ORAL_TABLET | Freq: Once | ORAL | Status: AC
  Administered 2017-10-25: 1 via ORAL
  Filled 2017-10-25: qty 1

## 2017-10-25 MED ORDER — ONDANSETRON 4 MG PO TBDP
4.0000 mg | ORAL_TABLET | Freq: Once | ORAL | Status: AC
  Administered 2017-10-25: 4 mg via ORAL
  Filled 2017-10-25: qty 1

## 2017-10-25 MED ORDER — CEPHALEXIN 500 MG PO CAPS: 500.0000 mg | ORAL_CAPSULE | Freq: Four times a day (QID) | ORAL | 0 refills | Status: DC

## 2017-10-25 MED ORDER — KETOROLAC TROMETHAMINE 60 MG/2ML IM SOLN
30.0000 mg | Freq: Once | INTRAMUSCULAR | Status: AC
  Administered 2017-10-25: 30 mg via INTRAMUSCULAR
  Filled 2017-10-25: qty 2

## 2017-10-25 MED ORDER — CEPHALEXIN 250 MG PO CAPS
500.0000 mg | ORAL_CAPSULE | Freq: Once | ORAL | Status: AC
  Administered 2017-10-25: 500 mg via ORAL
  Filled 2017-10-25: qty 2

## 2017-10-25 MED ORDER — HYDROCODONE-ACETAMINOPHEN 5-325 MG PO TABS: 1.0000 | ORAL_TABLET | Freq: Four times a day (QID) | ORAL | 0 refills | Status: DC | PRN

## 2017-10-25 MED ORDER — LIDOCAINE HCL 2 % IJ SOLN
20.0000 mL | Freq: Once | INTRAMUSCULAR | Status: AC
  Administered 2017-10-25: 400 mg
  Filled 2017-10-25: qty 20

## 2017-10-25 NOTE — Discharge Instructions (Signed)
Change the dressing daily, follow up with your doctor in 10 days for wound check and suture removal. If you have any problems before then such as drainage, redness, severe pain, fever or numbness return here immediately

## 2017-10-25 NOTE — ED Triage Notes (Signed)
Patient was trying to get wheel off trailer and it dropped on his hands. Crush injury to right wrist and left hand with left shoulder and lower back pain, swelling noted, minimal bleeding

## 2017-10-25 NOTE — ED Provider Notes (Signed)
[facility] EMERGENCY DEPARTMENT Provider Note   CSN: [id number] Arrival date & time: 10/25/17  1832     History   Chief Complaint Chief Complaint  Patient presents with  . hand injury/back injury    Keith Ramos is a 62 y.o. male who presents to the ED with right wrist and hand pain, left shoulder and lower back pain. Patient reports he was trying to get a wheel off a trailer and it dropped on his hands. While trying to pull his hands out he pulled his shoulder and back muscles.   Keith  Past Medical History:  Diagnosis Date  . Hypertension     Patient Active Problem List   Diagnosis Date Noted  . COLONIC POLYPS 05/26/2007  . PEPTIC STRICTURE 05/26/2007  . DIVERTICULOSIS, COLON W/O HEM 05/26/2007  . HYPERTENSION, BENIGN ESSENTIAL, LABILE 04/28/2007  . REFLUX, ESOPHAGEAL 04/28/2007  . 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 KNEE SPRAIN, MEDIAL COLLATERAL LIGAMENT 03/23/2007    Past Surgical History:  Procedure Laterality Date  . HERNIA REPAIR         Home Medications    Prior to Admission medications   Medication Sig Start Date End Date Taking? Authorizing Provider  [place] alk-PHENObarbital (DONNATAL) 16.2 MG tablet Take 1 tablet by mouth as needed. Patient not taking: Reported on 08/27/2015 08/19/15   Carrie Mew, MD  cephALEXin (KEFLEX) 500 MG capsule Take 1 capsule (500 mg total) by mouth 4 (four) times daily. 10/25/17   Ashley Murrain, NP  diphenhydrAMINE (BENADRYL) 25 mg capsule Take 2 capsules (50 mg total) by mouth every 6 (six) hours as needed. 08/19/15   Carrie Mew, MD  HYDROcodone-acetaminophen (NORCO) 5-325 MG tablet Take 1 tablet by mouth every 6 (six) hours as needed. 10/25/17   Ashley Murrain, NP  metoCLOPramide (REGLAN) 10 MG tablet Take 1 tablet (10 mg total) by mouth 4 (four) times daily -  before meals and at bedtime. 08/19/15   Carrie Mew, MD  ranitidine (ZANTAC) 150 MG capsule Take 2 capsules (300 mg  total) by mouth 2 (two) times daily. 08/19/15   Carrie Mew, MD  sucralfate (CARAFATE) 1 G tablet Take 1 tablet (1 g total) by mouth 4 (four) times daily. 08/19/15   Carrie Mew, MD    Family History Family History  Problem Relation Age of Onset  . Hepatitis B Father   . Cirrhosis Father   . Colon cancer Maternal Grandfather     Social History Social History   Tobacco Use  . Smoking status: Former Research scientist (life sciences)  . Smokeless tobacco: Never Used  Substance Use Topics  . Alcohol use: Yes  . Drug use: No     Allergies   Patient has no known allergies.   Review of Systems Review of Systems  Musculoskeletal: Positive for arthralgias and back pain.  Skin: Positive for wound.  All other systems reviewed and are negative.    Physical Exam Updated Vital Signs BP (!) 189/109   Pulse 67   Temp 98.3 F (36.8 C) (Oral)   Resp 17   SpO2 96%   Physical Exam  Constitutional: He appears well-developed and well-nourished. No distress.  HENT:  Head: Normocephalic and atraumatic.  Eyes: Conjunctivae and EOM are normal.  Neck: Normal range of motion. Neck supple.  Cardiovascular: Normal rate and regular rhythm.  Pulmonary/Chest: Effort normal and breath sounds normal.  Abdominal: Soft. There is no tenderness.  Musculoskeletal:       Left shoulder:  He exhibits tenderness. He exhibits normal range of motion, no deformity, no laceration, normal pulse and normal strength.       Lumbar back: He exhibits tenderness.       Right hand: He exhibits tenderness, laceration and swelling. He exhibits normal range of motion and normal capillary refill. Normal sensation noted. Normal strength noted. He exhibits no thumb/finger opposition.       Left hand: He exhibits tenderness, laceration and swelling. He exhibits normal range of motion and normal capillary refill. Normal sensation noted. Normal strength noted. He exhibits no thumb/finger opposition.       Hands: Avulsion laceration to  the dorsum of the right hand.  Puncture lacerations to the dorsum of the left hand.  Radial pulses 2+, adequate circulation.   Neurological: He is alert.  Skin: Skin is warm and dry.  Psychiatric: He has a normal mood and affect. His behavior is normal.  Nursing note and vitals reviewed.    ED Treatments / Results  Labs (all labs ordered are listed, but only abnormal results are displayed) Labs Reviewed - No data to display   Radiology Dg Wrist Complete Right  Result Date: 10/25/2017 CLINICAL DATA:  Injury to the left hand and right wrist, right wrist laceration to the distal forearm EXAM: RIGHT WRIST - COMPLETE 3+ VIEW COMPARISON:  None. FINDINGS: No acute displaced fracture or malalignment. Gas in the dorsal soft tissues. No radiopaque foreign body IMPRESSION: 1. No acute osseous abnormality 2. Gas in the dorsal soft tissues presumably relates to the laceration. Electronically Signed   By: Donavan Foil M.D.   On: 10/25/2017 19:24   Dg Shoulder Left  Result Date: 10/25/2017 CLINICAL DATA:  Injury, pain EXAM: LEFT SHOULDER - 2+ VIEW COMPARISON:  01/01/2015 FINDINGS: Left lung apex is clear. No fracture or malalignment. Moderate AC joint degenerative change. IMPRESSION: No acute osseous abnormality. Moderate AC joint degenerative changes. Electronically Signed   By: Donavan Foil M.D.   On: 10/25/2017 19:36   Dg Hand Complete Left  Result Date: 10/25/2017 CLINICAL DATA:  Injury EXAM: LEFT HAND - COMPLETE 3+ VIEW COMPARISON:  None. FINDINGS: No fracture or malalignment. No radiopaque foreign body. Mild degenerative changes at the second and fifth DIP joints. Triangular fibrocartilage calcification. Gas within the soft tissues of the dorsal hand. IMPRESSION: 1. No acute osseous abnormality 2. Gas within the dorsal soft tissues presumably related to laceration. 3. Chondrocalcinosis Electronically Signed   By: Donavan Foil M.D.   On: 10/25/2017 19:34    Procedures .Marland KitchenLaceration  Repair Date/Time: 10/25/2017 11:00 PM Performed by: Ashley Murrain, NP Authorized by: Ashley Murrain, NP   Consent:    Consent obtained:  Verbal   Consent given by:  Patient   Risks discussed:  Infection, pain and poor wound healing   Alternatives discussed:  No treatment Anesthesia (see MAR for exact dosages):    Anesthesia method:  Local infiltration   Local anesthetic:  Lidocaine 2% w/o epi Laceration details:    Location:  Hand   Hand location:  R hand, dorsum   Length (cm):  4 Repair type:    Repair type:  Simple Pre-procedure details:    Preparation:  Patient was prepped and draped in usual sterile fashion and imaging obtained to evaluate for foreign bodies Exploration:    Hemostasis achieved with:  Direct pressure   Wound exploration: entire depth of wound probed and visualized     Contaminated: yes   Treatment:    Area cleansed  with:  Betadine and saline   Amount of cleaning:  Extensive   Irrigation solution:  Sterile saline   Irrigation method:  Syringe   Visualized foreign bodies/material removed: yes   Skin repair:    Repair method:  Sutures   Suture technique:  Simple interrupted   Number of sutures:  3 Approximation:    Approximation:  Loose Post-procedure details:    Dressing:  Antibiotic ointment and sterile dressing   Patient tolerance of procedure:  Tolerated well, no immediate complications Comments:     Wound contaminated with dirt. After lidocaine infiltrate wounds were scrubbed with Betadine scrub brush and wounds irrigated with water and then NSS. Wound closed loosely. Patient is up to date on tetanus.  Irrigation Date/Time: 10/26/2017 1:36 AM Performed by: Ashley Murrain, NP Authorized by: Ashley Murrain, NP  Consent: Verbal consent obtained. Risks and benefits: risks, benefits and alternatives were discussed Consent given by: patient Patient understanding: patient states understanding of the procedure being performed Imaging studies: imaging studies  available Required items: required blood products, implants, devices, and special equipment available Patient identity confirmed: verbally with patient Preparation: Patient was prepped and draped in the usual sterile fashion. Local anesthesia used: yes Anesthesia: local infiltration  Anesthesia: Local anesthesia used: yes Local Anesthetic: lidocaine 2% without epinephrine Anesthetic total: 3 mL  Sedation: Patient sedated: no  Patient tolerance: Patient tolerated the procedure well with no immediate complications Comments: Wound also cleaned with Betadine scrub brush and irrigated with tap water and then with sterile NSS. Wounds to the left hand. These wounds were not sutured but left open due to puncture wounds and increased chance of infection.     (including critical care time)  Medications Ordered in ED Medications  oxyCODONE-acetaminophen (PERCOCET/ROXICET) 5-325 MG per tablet 1 tablet (1 tablet Oral Given 10/25/17 2219)  ketorolac (TORADOL) injection 30 mg (30 mg Intramuscular Given 10/25/17 2219)  lidocaine (XYLOCAINE) 2 % (with pres) injection 400 mg (400 mg Infiltration Given 10/25/17 2304)  ondansetron (ZOFRAN-ODT) disintegrating tablet 4 mg (4 mg Oral Given 10/25/17 2219)  cephALEXin (KEFLEX) capsule 500 mg (500 mg Oral Given 10/25/17 2219)     Initial Impression / Assessment and Plan / ED Course  I have reviewed the triage vital signs and the nursing notes. 62 y.o. male with bilateral hand lacerations and contusions and muscle strain of back and shoulder stable for d/c without fracture or dislocation noted on x-rays. Patient started on antibiotics, dressing applied to wounds. Return precautions discussed in detail. Patient agrees with plan. Pain controlled in the ED. Patient with elevated BP and on medications. Patient aware that BP is elevated and states he is working with his doctor to get the right medication and right dose to control the BP. He has a follow up appointment in 10  days and will have his sutures removed at that time as well. He will return here sooner for any problems.   Final Clinical Impressions(s) / ED Diagnoses   Final diagnoses:  Laceration of right hand without foreign body, initial encounter  Laceration of left hand without foreign body, initial encounter  Contusion of left hand, initial encounter  Contusion of right hand, initial encounter  Shoulder strain, left, initial encounter  Lumbosacral strain, initial encounter    ED Discharge Orders        Ordered    cephALEXin (KEFLEX) 500 MG capsule  4 times daily     10/25/17 2332    HYDROcodone-acetaminophen (NORCO) 5-325 MG tablet  Every 6 hours PRN     10/25/17 2332       Debroah Baller [place], [place] 10/26/17 Aretha Parrot    Duffy Bruce, MD 10/26/17 [phone]

## 2018-02-21 ENCOUNTER — Encounter (HOSPITAL_BASED_OUTPATIENT_CLINIC_OR_DEPARTMENT_OTHER): Payer: Self-pay | Admitting: Emergency Medicine

## 2018-02-21 ENCOUNTER — Other Ambulatory Visit: Payer: Self-pay

## 2018-02-21 ENCOUNTER — Emergency Department (HOSPITAL_BASED_OUTPATIENT_CLINIC_OR_DEPARTMENT_OTHER)
Admission: EM | Admit: 2018-02-21 | Discharge: 2018-02-22 | Disposition: A | Discharge status: Home / Self Care | Payer: Self-pay | Attending: Emergency Medicine | Admitting: Emergency Medicine

## 2018-02-21 DIAGNOSIS — Z79899 Other long term (current) drug therapy: Secondary | ICD-10-CM | POA: Insufficient documentation

## 2018-02-21 DIAGNOSIS — E860 Dehydration: Secondary | ICD-10-CM | POA: Insufficient documentation

## 2018-02-21 DIAGNOSIS — Z87891 Personal history of nicotine dependence: Secondary | ICD-10-CM | POA: Insufficient documentation

## 2018-02-21 DIAGNOSIS — I10 Essential (primary) hypertension: Secondary | ICD-10-CM | POA: Insufficient documentation

## 2018-02-21 DIAGNOSIS — R252 Cramp and spasm: Secondary | ICD-10-CM | POA: Insufficient documentation

## 2018-02-21 LAB — BASIC METABOLIC PANEL
Anion gap: 7 (ref 5–15)
BUN: 9 mg/dL (ref 6–20)
CALCIUM: 8.8 mg/dL — AB (ref 8.9–10.3)
CO2: 27 mmol/L (ref 22–32)
CREATININE: 0.58 mg/dL — AB (ref 0.61–1.24)
Chloride: 100 mmol/L — ABNORMAL LOW (ref 101–111)
GFR calc Af Amer: >60 mL/min (ref >60)
Glucose, Bld: 104 mg/dL — ABNORMAL HIGH (ref 65–99)
Potassium: 4.2 mmol/L (ref 3.5–5.1)
Sodium: 134 mmol/L — ABNORMAL LOW (ref 135–145)

## 2018-02-21 LAB — HEPATIC FUNCTION PANEL
ALT: 20 U/L (ref 17–63)
AST: 23 U/L (ref 15–41)
Albumin: 3.7 g/dL (ref 3.5–5.0)
Alkaline Phosphatase: 77 U/L (ref 38–126)
BILIRUBIN INDIRECT: 0.6 mg/dL (ref 0.3–0.9)
Bilirubin, Direct: 0.1 mg/dL (ref 0.1–0.5)
TOTAL PROTEIN: 7.2 g/dL (ref 6.5–8.1)
Total Bilirubin: 0.7 mg/dL (ref 0.3–1.2)

## 2018-02-21 LAB — CBC
HCT: 33.3 % — ABNORMAL LOW (ref 39.0–52.0)
Hemoglobin: 11.5 g/dL — ABNORMAL LOW (ref 13.0–17.0)
MCH: 30.2 pg (ref 26.0–34.0)
MCHC: 34.5 g/dL (ref 30.0–36.0)
MCV: 87.4 fL (ref 78.0–100.0)
PLATELETS: 279 10*3/uL (ref 150–400)
RBC: 3.81 MIL/uL — ABNORMAL LOW (ref 4.22–5.81)
RDW: 13.4 % (ref 11.5–15.5)
WBC: 11.2 10*3/uL — AB (ref 4.0–10.5)

## 2018-02-21 LAB — TROPONIN I: Troponin I: <0.03 ng/mL (ref <0.03)

## 2018-02-21 LAB — CK: CK TOTAL: 233 U/L (ref 49–397)

## 2018-02-21 MED ORDER — KETOROLAC TROMETHAMINE 30 MG/ML IJ SOLN
30.0000 mg | Freq: Once | INTRAMUSCULAR | Status: AC
  Administered 2018-02-22: 30 mg via INTRAVENOUS
  Filled 2018-02-21: qty 1

## 2018-02-21 MED ORDER — SODIUM CHLORIDE 0.9 % IV BOLUS
1000.0000 mL | Freq: Once | INTRAVENOUS | Status: AC
  Administered 2018-02-22: 1000 mL via INTRAVENOUS

## 2018-02-21 NOTE — ED Triage Notes (Signed)
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 The patient states that he is unable to walk

## 2018-02-22 LAB — URINALYSIS, ROUTINE W REFLEX MICROSCOPIC
Bilirubin Urine: NEGATIVE
Glucose, UA: NEGATIVE mg/dL
Hgb urine dipstick: NEGATIVE
Ketones, ur: 15 mg/dL — AB
LEUKOCYTES UA: NEGATIVE
NITRITE: NEGATIVE
PH: 6.5 (ref 5.0–8.0)
Protein, ur: NEGATIVE mg/dL
SPECIFIC GRAVITY, URINE: 1.02 (ref 1.005–1.030)

## 2018-02-22 MED ORDER — DIAZEPAM 5 MG PO TABS
5.0000 mg | ORAL_TABLET | Freq: Once | ORAL | Status: AC
  Administered 2018-02-22: 5 mg via ORAL
  Filled 2018-02-22: qty 1

## 2018-02-22 MED ORDER — SODIUM CHLORIDE 0.9 % IV BOLUS
1000.0000 mL | Freq: Once | INTRAVENOUS | Status: AC
  Administered 2018-02-22: 1000 mL via INTRAVENOUS

## 2018-02-22 MED ORDER — HYDROCODONE-ACETAMINOPHEN 5-325 MG PO TABS
1.0000 | ORAL_TABLET | Freq: Once | ORAL | Status: AC
  Administered 2018-02-22: 1 via ORAL
  Filled 2018-02-22: qty 1

## 2018-02-22 NOTE — Discharge Instructions (Addendum)
You were seen today for cramping and leg pain.  Your work-up is largely reassuring.  You do have some clinical signs of dehydration.  You were given fluids and anti-inflammatories.  It is very important that you stay hydrated and avoid strenuous activity over the next 2 to 3 days.

## 2018-02-22 NOTE — ED Provider Notes (Signed)
[facility] Provider Note   CSN: [id number] Arrival date & time: 02/21/18  2009     History   Chief Complaint Chief Complaint  Patient presents with  . Muscle Pain    HPI QAIS Keith Ramos is a 62 y.o. male.  HPI  This is a 62 year old male with a history of hypertension who presents with body aches and pains.  Patient reports that over the last 24 hours he has had progressively worsening lower back spasms and bilateral leg pain.  He describes as crampy.  He also reports bilateral groin pain.  He states that he pulled weeds all day yesterday but this is not uncommon for him.  He reports that he thinks he stayed hydrated.  He did take a bike ride a couple of days ago which she has not done in some time.  He thinks this may be related.  He currently rates his pain at 9 out of 10.  He took Aleve with only minimal improvement of his symptoms.  He denies any urinary symptoms.  He states "is gotten so bad that I just cannot walk."  Past Medical History:  Diagnosis Date  . Hypertension     Patient Active Problem List   Diagnosis Date Noted  . COLONIC POLYPS 05/26/2007  . PEPTIC STRICTURE 05/26/2007  . DIVERTICULOSIS, COLON W/O HEM 05/26/2007  . HYPERTENSION, BENIGN ESSENTIAL, LABILE 04/28/2007  . REFLUX, ESOPHAGEAL 04/28/2007  . 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 KNEE SPRAIN, MEDIAL COLLATERAL LIGAMENT 03/23/2007    Past Surgical History:  Procedure Laterality Date  . HERNIA REPAIR          Home Medications    Prior to Admission medications   Medication Sig Start Date End Date Taking? Authorizing Provider  lisinopril (PRINIVIL,ZESTRIL) 20 MG tablet Take 20 mg by mouth daily.   Yes [provider]  [place] alk-PHENObarbital (DONNATAL) 16.2 MG tablet Take 1 tablet by mouth as needed. Patient not taking: Reported on 08/27/2015 08/19/15   Carrie Mew, MD  cephALEXin (KEFLEX) 500 MG capsule Take 1 capsule (500 mg total) by  mouth 4 (four) times daily. 10/25/17   Ashley Murrain, NP  diphenhydrAMINE (BENADRYL) 25 mg capsule Take 2 capsules (50 mg total) by mouth every 6 (six) hours as needed. 08/19/15   Carrie Mew, MD  HYDROcodone-acetaminophen (NORCO) 5-325 MG tablet Take 1 tablet by mouth every 6 (six) hours as needed. 10/25/17   Ashley Murrain, NP  metoCLOPramide (REGLAN) 10 MG tablet Take 1 tablet (10 mg total) by mouth 4 (four) times daily -  before meals and at bedtime. 08/19/15   Carrie Mew, MD  ranitidine (ZANTAC) 150 MG capsule Take 2 capsules (300 mg total) by mouth 2 (two) times daily. 08/19/15   Carrie Mew, MD  sucralfate (CARAFATE) 1 G tablet Take 1 tablet (1 g total) by mouth 4 (four) times daily. 08/19/15   Carrie Mew, MD    Family History Family History  Problem Relation Age of Onset  . Hepatitis B Father   . Cirrhosis Father   . Colon cancer Maternal Grandfather     Social History Social History   Tobacco Use  . Smoking status: Former Research scientist (life sciences)  . Smokeless tobacco: Never Used  Substance Use Topics  . Alcohol use: Yes  . Drug use: No     Allergies   Patient has no known allergies.   Review of Systems Review of Systems  Constitutional: Negative for fever.  Respiratory: Negative  for shortness of breath.   Cardiovascular: Negative for chest pain.  Gastrointestinal: Negative for abdominal pain, diarrhea, nausea and vomiting.  Genitourinary: Negative for dysuria and hematuria.  Musculoskeletal: Positive for back pain and myalgias.  Neurological: Negative for headaches.  All other systems reviewed and are negative.    Physical Exam Updated Vital Signs BP (!) 148/88   Pulse 72   Temp 98.6 F (37 C) (Oral)   Resp 18   Ht '5\' 7"'  (1.702 m)   Wt 67.1 kg (148 lb)   SpO2 94%   BMI 23.18 kg/m   Physical Exam  Constitutional: He is oriented to person, place, and time. He appears well-developed and well-nourished.  Uncomfortable appearing but nontoxic  HENT:    Head: Normocephalic and atraumatic.  Eyes: Pupils are equal, round, and reactive to light.  Cardiovascular: Normal rate, regular rhythm and normal heart sounds.  No murmur heard. Pulmonary/Chest: Effort normal and breath sounds normal. No respiratory distress. He has no wheezes.  Abdominal: Soft. Bowel sounds are normal. There is no tenderness. There is no rebound.  Genitourinary:  Genitourinary Comments: Normal circumcised penis, no scrotal swelling or mass noted, no crepitus  Musculoskeletal: He exhibits tenderness. He exhibits no edema.  Diffuse muscular tenderness bilateral lower extremities  Neurological: He is alert and oriented to person, place, and time.  Exam somewhat limited secondary to pain, normal reflexes bilaterally, 5 out of 5 strength with dorsi and plantarflexion of the bilateral feet  Skin: Skin is warm and dry.  Psychiatric: He has a normal mood and affect.  Nursing note and vitals reviewed.    ED Treatments / Results  Labs (all labs ordered are listed, but only abnormal results are displayed) Labs Reviewed  BASIC METABOLIC PANEL - Abnormal; Notable for the following components:      Result Value   Sodium 134 (*)    Chloride 100 (*)    Glucose, Bld 104 (*)    Creatinine, Ser 0.58 (*)    Calcium 8.8 (*)    All other components within normal limits  CBC - Abnormal; Notable for the following components:   WBC 11.2 (*)    RBC 3.81 (*)    Hemoglobin 11.5 (*)    HCT 33.3 (*)    All other components within normal limits  URINALYSIS, ROUTINE W REFLEX MICROSCOPIC - Abnormal; Notable for the following components:   Ketones, ur 15 (*)    All other components within normal limits  TROPONIN I  CK  HEPATIC FUNCTION PANEL    EKG None  Radiology No results found.  Procedures Procedures (including critical care time)  Medications Ordered in ED Medications  sodium chloride 0.9 % bolus 1,000 mL (0 mLs Intravenous Stopped 02/22/18 0121)  ketorolac (TORADOL) 30  MG/ML injection 30 mg (30 mg Intravenous Given 02/22/18 0025)  diazepam (VALIUM) tablet 5 mg (5 mg Oral Given 02/22/18 0024)  HYDROcodone-acetaminophen (NORCO/VICODIN) 5-325 MG per tablet 1 tablet (1 tablet Oral Given 02/22/18 0137)  sodium chloride 0.9 % bolus 1,000 mL (0 mLs Intravenous Stopped 02/22/18 0507)     Initial Impression / Assessment and Plan / ED Course  I have reviewed the triage vital signs and the nursing notes.  Pertinent labs & imaging results that were available during my care of the patient were reviewed by me and considered in my medical decision making (see chart for details).     Patient presents with leg pain and body cramping.  He is uncomfortable appearing but nontoxic.  Vital signs are reassuring.  There is nothing specific or focal on his exam.  Rhabdomyolysis is a consideration.  General dehydration or metabolite abnormality is also consideration.  Patient was given fluids and Toradol.  Lab work-up shows no significant metabolic derangement.  He does have ketones in his urine after hydration which would suggest dehydration.  CK is within normal limits.  Troponin is negative.  Patient was given a total of 2 L of fluids and states he feels much better.  He is ambulatory.  Will discharge home.  Recommend increased hydration over the next 2 to 3 days and avoiding strenuous activity.  After history, exam, and medical workup I feel the patient has been appropriately medically screened and is safe for discharge home. Pertinent diagnoses were discussed with the patient. Patient was given return precautions.   Final Clinical Impressions(s) / ED Diagnoses   Final diagnoses:  Dehydration  Leg cramps    ED Discharge Orders    None       Merryl Hacker, MD 02/22/18 [phone]

## 2018-02-22 NOTE — Progress Notes (Signed)
Patient was able to shuffle around the department with some difficulty and go to the bathroom unassisted for a total distance of approximately 120 feet.  Patient states that he is very sore and has trouble moving his legs but is much better than when he first came in.

## 2018-02-22 NOTE — ED Notes (Addendum)
Pt ambulated by RT. See their notes.

## 2018-02-22 NOTE — ED Notes (Signed)
Ginger ale given to pt.  

## 2018-02-22 NOTE — ED Notes (Signed)
Sister's phone number Berdine Addison [phone]

## 2018-02-22 NOTE — ED Notes (Signed)
Sister states she will be there to pick him up in 15 minutes. Pt assisted to lobby in wheelchair to wait for his ride.

## 2018-02-22 NOTE — ED Notes (Signed)
Pt's sister will be picking him up

## 2018-03-11 ENCOUNTER — Encounter: Payer: Self-pay | Admitting: Nurse Practitioner

## 2018-03-16 ENCOUNTER — Encounter: Payer: Self-pay | Admitting: Nurse Practitioner

## 2018-03-16 ENCOUNTER — Ambulatory Visit (INDEPENDENT_AMBULATORY_CARE_PROVIDER_SITE_OTHER): Payer: Self-pay | Admitting: Nurse Practitioner

## 2018-03-16 ENCOUNTER — Other Ambulatory Visit (INDEPENDENT_AMBULATORY_CARE_PROVIDER_SITE_OTHER): Payer: Self-pay

## 2018-03-16 VITALS — BP 138/64 | HR 88 | Ht 67.0 in | Wt 145.0 lb

## 2018-03-16 DIAGNOSIS — D649 Anemia, unspecified: Secondary | ICD-10-CM

## 2018-03-16 DIAGNOSIS — R634 Abnormal weight loss: Secondary | ICD-10-CM

## 2018-03-16 LAB — TSH: TSH: 1.89 u[IU]/mL (ref 0.35–4.50)

## 2018-03-16 LAB — FERRITIN: FERRITIN: 419.5 ng/mL — AB (ref 22.0–322.0)

## 2018-03-16 LAB — HIGH SENSITIVITY CRP: CRP, High Sensitivity: 104.64 mg/L — ABNORMAL HIGH (ref 0.000–5.000)

## 2018-03-16 LAB — SEDIMENTATION RATE: SED RATE: 66 mm/h — AB (ref 0–20)

## 2018-03-16 MED ORDER — NA SULFATE-K SULFATE-MG SULF 17.5-3.13-1.6 GM/177ML PO SOLN: ORAL | 0 refills | Status: DC

## 2018-03-16 NOTE — Patient Instructions (Addendum)
If you are age 62 or older, your body mass index should be between 23-30. Your Body mass index is 22.71 kg/m. If this is out of the aforementioned range listed, please consider follow up with your Primary Care Provider.  If you are age 50 or younger, your body mass index should be between 19-25. Your Body mass index is 22.71 kg/m. If this is out of the aformentioned range listed, please consider follow up with your Primary Care Provider.   You have been scheduled for an endoscopy and colonoscopy. Please follow the written instructions given to you at your visit today. Please pick up your prep supplies at the pharmacy within the next 1-3 days. If you use inhalers (even only as needed), please bring them with you on the day of your procedure. Your physician has requested that you go to www.startemmi.com and enter the access code given to you at your visit today. This web site gives a general overview about your procedure. However, you should still follow specific instructions given to you by our office regarding your preparation for the procedure.  We have sent the following medications to your pharmacy for you to pick up at your convenience: [facility] provider has requested that you go to the basement level for lab work before leaving today. Press "B" on the elevator. The lab is located at the first door on the left as you exit the elevator.  Check ferritin, ESR, CRP and TSH  NO NSAIDS ( Motrin, Advil, Naproxen etc.)  HOLD IRON FOR FIVE DAYS PRIOR TO PROCEDURE.  Thank you for choosing me and [facility].   Tye Savoy, NP

## 2018-03-16 NOTE — Progress Notes (Addendum)
Chief Complaint: anemia  Referring Provider:     Benita Stabile, MD   ASSESSMENT AND PLAN;   1. 62 yo male with recent development of  mild normocytic anemia, generalized muscle pains, unexplained 18 pound weight loss . Baseline hgb not really known but patient says he gets annual labs done and no mention of anemia previously. In 2016 (Epic) hgb was 16.3, down to 12 now on 6/11/9. Patient started himself iron on 2 weeks ago -check ferritin though may be normal since he started iron a couple of weeks ago. -For further evaluation of anemia (and weight loss) patient will be scheduled for EGD. The risks and benefits of EGD were discussed and the patient agrees to proceed.  -His last screening colonoscopy was in 2008 (?where). His last one with [place] was in 2005. He is he is overdue for colonoscopy. Will schedule colonoscopy to be done same time as EGD. The risks and benefits of colonoscopy with possible polypectomy were discussed and the patient agrees to proceed.  -etiology of ongoing generalized muscle pain unclear. Will check TSH, ESR, CRP.   2. Torn / ruptured left bicep   HPI:     Patient is a 62 yo male with a hx of HTN but otherwise healthy. He is known remotely to Dr. Henrene Pastor from colonoscopy in 2005.Marland Kitchen Referred by PCP Dr. Hall Busing for anemia. Recent hgb 12 (it was 16 in 2016).  Patiet went to [place] on 6/2 because " I could not walk". This started after bike ride late May . He developed pain in groins / leg cramps  Seen in ED, treated for dehydration. CK was normal. Now having muscle pain all over his body. Feels like he is wearing lead boots by the end of the day. Can't get comfortable to sleep.  He has recently lost 18 pounds. He is having some intermittent chills, unsure about fevers. Patient found two ticks crawling on him within last few months. No known tick bites as he checks himself regularly. No skin rashes . No abdominal pain. No N/V. BMs normal except dark (taking iron).  Patient is almost distraught over sudden deterioration of health. Up until recently patient has been very athletic, participating in biking marathons. Anemia preceded NSAID use for the muscle aches  ED evaluation Wbc 11.2 hgb 11.5, MCV 87 Normal renal fucntion CK normal.  Liver chemistries negative.   Patient saw PCP for ED follow up on 6/10 Repeat hgb was actually better at 12 TIBC 282, UIBC 258 Iron 24 Iron sat 9    Past Medical History:  Diagnosis Date  . Hypertension     Past Surgical History:  Procedure Laterality Date  . HERNIA REPAIR  1977   62 yrs old multiple times since  . KNEE ARTHROSCOPY Left 2007   Family History  Problem Relation Age of Onset  . Hepatitis B Father   . Cirrhosis Father   . Colon cancer Maternal Grandfather 39  . Pancreatic cancer Maternal Grandmother   . Colon cancer Cousin 57   Social History   Tobacco Use  . Smoking status: Former Research scientist (life sciences)  . Smokeless tobacco: Never Used  Substance Use Topics  . Alcohol use: Not on file    Comment: occasionally  . Drug use: No   Current Outpatient Medications  Medication Sig Dispense Refill  . amLODipine (NORVASC) 5 MG tablet Take 5 mg by mouth daily.    Marland Kitchen Fe Bisgly-Vit C-Vit B12-FA (GENTLE IRON PO) Take by  mouth 2 (two) times daily. Iron Bisglycinate 25 MG    . lisinopril (PRINIVIL,ZESTRIL) 20 MG tablet Take 20 mg by mouth 2 (two) times daily.     . naproxen sodium (ALEVE) 220 MG tablet Take 220 mg by mouth 2 (two) times daily as needed.    Marland Kitchen omeprazole (PRILOSEC) 20 MG capsule Take 20 mg by mouth daily.    . traMADol (ULTRAM) 50 MG tablet Take 50 mg by mouth daily as needed.  5   No current facility-administered medications for this visit.    No Known Allergies   Review of Systems: All systems reviewed and negative except where noted in HPI.   Creatinine clearance cannot be calculated (Patient's most recent lab result is older than the maximum 21 days allowed.)   Physical Exam:    Wt  Readings from Last 3 Encounters:  03/16/18 145 lb (65.8 kg)  02/21/18 148 lb (67.1 kg)  08/27/15 156 lb (70.8 kg)    BP 138/64   Pulse 88   Ht '5\' 7"'  (1.702 m)   Wt 145 lb (65.8 kg)   BMI 22.71 kg/m  Constitutional:  Pleasant male in no acute distress. Psychiatric: Normal mood and affect. Behavior is normal. EENT: Pupils normal.  Conjunctivae are normal. No scleral icterus. Neck supple.  Cardiovascular: Normal rate, regular rhythm. No edema Pulmonary/chest: Effort normal and breath sounds normal. No wheezing, rales or rhonchi. Abdominal: Soft, nondistended, nontender. Bowel sounds active throughout. There are no masses palpable. No hepatomegaly. Neurological: Alert and oriented to person place and time. MS: deformities of proximal interphalangeal joints bilaterally Skin: Skin is warm and dry. No rashes noted.  Tye Savoy, NP  03/16/2018, 2:01 PM  Cc: Albina Billet, MD

## 2018-03-17 ENCOUNTER — Telehealth: Payer: Self-pay | Admitting: Nurse Practitioner

## 2018-03-19 ENCOUNTER — Telehealth: Payer: Self-pay | Admitting: Nurse Practitioner

## 2018-03-19 NOTE — Telephone Encounter (Signed)
Results are in Epic

## 2018-03-19 NOTE — Telephone Encounter (Signed)
Spoke with the patient. Reports he has had off and on vomiting since Wednesday night. Thinks he "ate something that did not agree" with him. He last vomited 4 hours ago. He is presently drinking water and tolerating. Feel "washed out." Urinating "but not much and it is dark." Saliva feels thick. His sister is on her way to him now. Recommended he go to the ED. Discussed briefly the abnormal labs. He states he was not dehydrated when these labs were drawn 3 days ago. He is also aware of Pedialyte for rehydration, though I have encouraged he consider the ED. Discussed the patient with Dr Silverio Decamp.

## 2018-03-22 ENCOUNTER — Observation Stay (HOSPITAL_COMMUNITY): Payer: Self-pay

## 2018-03-22 ENCOUNTER — Emergency Department (HOSPITAL_COMMUNITY): Payer: Self-pay

## 2018-03-22 ENCOUNTER — Other Ambulatory Visit: Payer: Self-pay

## 2018-03-22 ENCOUNTER — Inpatient Hospital Stay (HOSPITAL_COMMUNITY)
Admission: EM | Admit: 2018-03-22 | Discharge: 2018-03-24 | DRG: 641 | Disposition: A | Discharge status: Home / Self Care | Payer: Self-pay | Attending: Internal Medicine | Admitting: Internal Medicine

## 2018-03-22 ENCOUNTER — Encounter (HOSPITAL_COMMUNITY): Payer: Self-pay

## 2018-03-22 DIAGNOSIS — Z79899 Other long term (current) drug therapy: Secondary | ICD-10-CM

## 2018-03-22 DIAGNOSIS — E876 Hypokalemia: Secondary | ICD-10-CM | POA: Diagnosis present

## 2018-03-22 DIAGNOSIS — Z87891 Personal history of nicotine dependence: Secondary | ICD-10-CM

## 2018-03-22 DIAGNOSIS — N200 Calculus of kidney: Secondary | ICD-10-CM | POA: Diagnosis present

## 2018-03-22 DIAGNOSIS — R1084 Generalized abdominal pain: Secondary | ICD-10-CM

## 2018-03-22 DIAGNOSIS — A040 Enteropathogenic Escherichia coli infection: Secondary | ICD-10-CM | POA: Diagnosis present

## 2018-03-22 DIAGNOSIS — R109 Unspecified abdominal pain: Secondary | ICD-10-CM | POA: Diagnosis present

## 2018-03-22 DIAGNOSIS — I10 Essential (primary) hypertension: Secondary | ICD-10-CM | POA: Diagnosis present

## 2018-03-22 DIAGNOSIS — Z6821 Body mass index (BMI) 21.0-21.9, adult: Secondary | ICD-10-CM

## 2018-03-22 DIAGNOSIS — K5730 Diverticulosis of large intestine without perforation or abscess without bleeding: Secondary | ICD-10-CM | POA: Diagnosis present

## 2018-03-22 DIAGNOSIS — W57XXXA Bitten or stung by nonvenomous insect and other nonvenomous arthropods, initial encounter: Secondary | ICD-10-CM | POA: Diagnosis present

## 2018-03-22 DIAGNOSIS — D509 Iron deficiency anemia, unspecified: Secondary | ICD-10-CM | POA: Diagnosis present

## 2018-03-22 DIAGNOSIS — R509 Fever, unspecified: Secondary | ICD-10-CM

## 2018-03-22 DIAGNOSIS — E860 Dehydration: Principal | ICD-10-CM | POA: Diagnosis present

## 2018-03-22 DIAGNOSIS — Z79891 Long term (current) use of opiate analgesic: Secondary | ICD-10-CM

## 2018-03-22 DIAGNOSIS — K219 Gastro-esophageal reflux disease without esophagitis: Secondary | ICD-10-CM | POA: Diagnosis present

## 2018-03-22 DIAGNOSIS — R112 Nausea with vomiting, unspecified: Secondary | ICD-10-CM | POA: Diagnosis present

## 2018-03-22 DIAGNOSIS — E440 Moderate protein-calorie malnutrition: Secondary | ICD-10-CM

## 2018-03-22 LAB — CBC WITH DIFFERENTIAL/PLATELET
BASOS ABS: 0 10*3/uL (ref 0.0–0.1)
Basophils Relative: 0 %
EOS ABS: 0 10*3/uL (ref 0.0–0.7)
EOS PCT: 0 %
HCT: 38.2 % — ABNORMAL LOW (ref 39.0–52.0)
HEMOGLOBIN: 12.9 g/dL — AB (ref 13.0–17.0)
Lymphocytes Relative: 14 %
Lymphs Abs: 1.7 10*3/uL (ref 0.7–4.0)
MCH: 28.2 pg (ref 26.0–34.0)
MCHC: 33.8 g/dL (ref 30.0–36.0)
MCV: 83.6 fL (ref 78.0–100.0)
Monocytes Absolute: 0.8 10*3/uL (ref 0.1–1.0)
Monocytes Relative: 7 %
NEUTROS PCT: 79 %
Neutro Abs: 9.3 10*3/uL — ABNORMAL HIGH (ref 1.7–7.7)
PLATELETS: 493 10*3/uL — AB (ref 150–400)
RBC: 4.57 MIL/uL (ref 4.22–5.81)
RDW: 13 % (ref 11.5–15.5)
WBC: 11.8 10*3/uL — AB (ref 4.0–10.5)

## 2018-03-22 LAB — URINALYSIS, ROUTINE W REFLEX MICROSCOPIC
Bilirubin Urine: NEGATIVE
Glucose, UA: NEGATIVE mg/dL
Hgb urine dipstick: NEGATIVE
Ketones, ur: NEGATIVE mg/dL
LEUKOCYTES UA: NEGATIVE
Nitrite: NEGATIVE
PROTEIN: NEGATIVE mg/dL
Specific Gravity, Urine: 1.013 (ref 1.005–1.030)
pH: 8 (ref 5.0–8.0)

## 2018-03-22 LAB — I-STAT CG4 LACTIC ACID, ED: Lactic Acid, Venous: 0.89 mmol/L (ref 0.5–1.9)

## 2018-03-22 LAB — COMPREHENSIVE METABOLIC PANEL
ALT: 23 U/L (ref 0–44)
AST: 18 U/L (ref 15–41)
Albumin: 3.5 g/dL (ref 3.5–5.0)
Alkaline Phosphatase: 84 U/L (ref 38–126)
Anion gap: 10 (ref 5–15)
BILIRUBIN TOTAL: 0.4 mg/dL (ref 0.3–1.2)
BUN: 16 mg/dL (ref 8–23)
CO2: 29 mmol/L (ref 22–32)
Calcium: 9.2 mg/dL (ref 8.9–10.3)
Chloride: 97 mmol/L — ABNORMAL LOW (ref 98–111)
Creatinine, Ser: 0.86 mg/dL (ref 0.61–1.24)
Glucose, Bld: 109 mg/dL — ABNORMAL HIGH (ref 70–99)
POTASSIUM: 3.2 mmol/L — AB (ref 3.5–5.1)
Sodium: 136 mmol/L (ref 135–145)
TOTAL PROTEIN: 7.2 g/dL (ref 6.5–8.1)

## 2018-03-22 LAB — RAPID URINE DRUG SCREEN, HOSP PERFORMED
Amphetamines: NOT DETECTED
BENZODIAZEPINES: NOT DETECTED
COCAINE: NOT DETECTED
Opiates: POSITIVE — AB
Tetrahydrocannabinol: POSITIVE — AB

## 2018-03-22 LAB — CK: CK TOTAL: 44 U/L — AB (ref 49–397)

## 2018-03-22 LAB — MAGNESIUM: Magnesium: 2.3 mg/dL (ref 1.7–2.4)

## 2018-03-22 LAB — LIPASE, BLOOD: LIPASE: 23 U/L (ref 11–51)

## 2018-03-22 LAB — TROPONIN I: Troponin I: <0.03 ng/mL (ref <0.03)

## 2018-03-22 LAB — SEDIMENTATION RATE: SED RATE: 40 mm/h — AB (ref 0–16)

## 2018-03-22 MED ORDER — GI COCKTAIL ~~LOC~~
30.0000 mL | Freq: Once | ORAL | Status: AC
  Administered 2018-03-22: 30 mL via ORAL
  Filled 2018-03-22: qty 30

## 2018-03-22 MED ORDER — POTASSIUM CHLORIDE 10 MEQ/100ML IV SOLN
10.0000 meq | INTRAVENOUS | Status: AC
  Administered 2018-03-22 (×2): 10 meq via INTRAVENOUS
  Filled 2018-03-22 (×2): qty 100

## 2018-03-22 MED ORDER — SODIUM CHLORIDE 0.9 % IV BOLUS
1000.0000 mL | Freq: Once | INTRAVENOUS | Status: AC
  Administered 2018-03-22: 1000 mL via INTRAVENOUS

## 2018-03-22 MED ORDER — ONDANSETRON HCL 4 MG PO TABS: 4.0000 mg | ORAL_TABLET | Freq: Four times a day (QID) | ORAL | Status: DC | PRN

## 2018-03-22 MED ORDER — SODIUM CHLORIDE 0.9 % IV SOLN
INTRAVENOUS | Status: AC
  Administered 2018-03-22 – 2018-03-23 (×2): via INTRAVENOUS

## 2018-03-22 MED ORDER — PANTOPRAZOLE SODIUM 40 MG PO TBEC
40.0000 mg | DELAYED_RELEASE_TABLET | Freq: Every day | ORAL | Status: DC
  Administered 2018-03-22 – 2018-03-24 (×3): 40 mg via ORAL
  Filled 2018-03-22 (×3): qty 1

## 2018-03-22 MED ORDER — ONDANSETRON HCL 4 MG/2ML IJ SOLN: 4.0000 mg | Freq: Four times a day (QID) | INTRAMUSCULAR | Status: DC | PRN

## 2018-03-22 MED ORDER — ENOXAPARIN SODIUM 40 MG/0.4ML ~~LOC~~ SOLN
40.0000 mg | SUBCUTANEOUS | Status: DC
  Administered 2018-03-22 – 2018-03-23 (×2): 40 mg via SUBCUTANEOUS
  Filled 2018-03-22 (×2): qty 0.4

## 2018-03-22 MED ORDER — FAMOTIDINE IN NACL 20-0.9 MG/50ML-% IV SOLN
20.0000 mg | Freq: Once | INTRAVENOUS | Status: AC
  Administered 2018-03-22: 20 mg via INTRAVENOUS
  Filled 2018-03-22: qty 50

## 2018-03-22 MED ORDER — ONDANSETRON HCL 4 MG/2ML IJ SOLN
4.0000 mg | Freq: Once | INTRAMUSCULAR | Status: AC
Start: 2018-03-22 — End: 2018-03-22
  Administered 2018-03-22: 4 mg via INTRAVENOUS
  Filled 2018-03-22: qty 2

## 2018-03-22 MED ORDER — TRAMADOL HCL 50 MG PO TABS
50.0000 mg | ORAL_TABLET | Freq: Three times a day (TID) | ORAL | Status: DC | PRN
  Administered 2018-03-23: 100 mg via ORAL
  Filled 2018-03-22: qty 2

## 2018-03-22 MED ORDER — ACETAMINOPHEN 325 MG PO TABS: 650.0000 mg | ORAL_TABLET | Freq: Four times a day (QID) | ORAL | Status: DC | PRN

## 2018-03-22 MED ORDER — SODIUM CHLORIDE 0.9 % IV BOLUS
500.0000 mL | Freq: Once | INTRAVENOUS | Status: AC
  Administered 2018-03-22: 500 mL via INTRAVENOUS

## 2018-03-22 MED ORDER — ACETAMINOPHEN 650 MG RE SUPP: 650.0000 mg | Freq: Four times a day (QID) | RECTAL | Status: DC | PRN

## 2018-03-22 MED ORDER — HYDROCODONE-ACETAMINOPHEN 5-325 MG PO TABS
1.0000 | ORAL_TABLET | ORAL | Status: DC | PRN
  Administered 2018-03-22 – 2018-03-24 (×8): 2 via ORAL
  Filled 2018-03-22 (×8): qty 2

## 2018-03-22 MED ORDER — KETOROLAC TROMETHAMINE 30 MG/ML IJ SOLN
30.0000 mg | Freq: Once | INTRAMUSCULAR | Status: AC
  Administered 2018-03-22: 30 mg via INTRAVENOUS
  Filled 2018-03-22: qty 1

## 2018-03-22 MED ORDER — LISINOPRIL 20 MG PO TABS
20.0000 mg | ORAL_TABLET | Freq: Once | ORAL | Status: AC
  Administered 2018-03-22: 20 mg via ORAL
  Filled 2018-03-22: qty 1

## 2018-03-22 MED ORDER — POTASSIUM CHLORIDE CRYS ER 20 MEQ PO TBCR
40.0000 meq | EXTENDED_RELEASE_TABLET | Freq: Once | ORAL | Status: AC
  Administered 2018-03-22: 40 meq via ORAL
  Filled 2018-03-22: qty 2

## 2018-03-22 MED ORDER — MORPHINE SULFATE (PF) 4 MG/ML IV SOLN
4.0000 mg | Freq: Once | INTRAVENOUS | Status: AC
  Administered 2018-03-22: 4 mg via INTRAVENOUS
  Filled 2018-03-22: qty 1

## 2018-03-22 MED ORDER — IOPAMIDOL (ISOVUE-300) INJECTION 61%
100.0000 mL | Freq: Once | INTRAVENOUS | Status: AC | PRN
  Administered 2018-03-22: 100 mL via INTRAVENOUS

## 2018-03-22 NOTE — ED Notes (Signed)
EKG printed and handed to EDP.

## 2018-03-22 NOTE — ED Notes (Signed)
Patient transported to X-ray 

## 2018-03-22 NOTE — ED Notes (Signed)
Bed: EB91 Expected date:  Expected time:  Means of arrival:  Comments: EMS-nausea/vomiting

## 2018-03-22 NOTE — ED Provider Notes (Signed)
[facility] DEPT Provider Note   CSN: [id number] Arrival date & time: 03/22/18  1230     History   Chief Complaint Chief Complaint  Patient presents with  . Abdominal Pain  . Emesis  . Nausea    HPI Keith Ramos is a 62 y.o. male with a past medical history of hypertension, GERD, diverticulosis, who presents to ED for evaluation of 6-day history of generalized abdominal pain, more severe in the left lower quadrant, several episodes of nonbloody, nonbilious emesis.  He states that he had one normal bowel movement when the symptoms began but has not had a bowel movement since then.  He took Dramamine but was unable to keep it down.  He has also not able to keep any of his home medications down without vomiting.  No sick contacts with similar symptoms.  He originally thought it was due to something that he ate.  He is scheduled for a colonoscopy on July 15, for surveillance of colon cancer which runs in his family.  He denies any dysuria, back pain, hematuria, chest pain, recent travel, fever, hematemesis, hematochezia or melena.  Prior abdominal surgeries include 3 hernia repairs.  Reports occasional alcohol use but denies any tobacco or other drug use.  HPI  Past Medical History:  Diagnosis Date  . Hypertension     Patient Active Problem List   Diagnosis Date Noted  . Hypokalemia 03/22/2018  . Nausea and vomiting 03/22/2018  . Abdominal pain 03/22/2018  . COLONIC POLYPS 05/26/2007  . PEPTIC STRICTURE 05/26/2007  . DIVERTICULOSIS, COLON W/O HEM 05/26/2007  . HYPERTENSION, BENIGN ESSENTIAL, LABILE 04/28/2007  . REFLUX, ESOPHAGEAL 04/28/2007  . 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 KNEE SPRAIN, MEDIAL COLLATERAL LIGAMENT 03/23/2007    Past Surgical History:  Procedure Laterality Date  . HERNIA REPAIR  1977   62 yrs old multiple times since  . KNEE ARTHROSCOPY Left 2007        Home Medications    Prior to Admission medications     Medication Sig Start Date End Date Taking? Authorizing Provider  amLODipine (NORVASC) 5 MG tablet Take 5 mg by mouth daily.   Yes [provider]  dimenhyDRINATE (DRAMAMINE PO) Take 1 tablet by mouth daily as needed (nausea).   Yes [provider]  LIDOCAINE EX Apply 1 application topically daily as needed (pain).   Yes [provider]  lisinopril (PRINIVIL,ZESTRIL) 20 MG tablet Take 20 mg by mouth 2 (two) times daily.    Yes [provider]  omeprazole (PRILOSEC) 20 MG capsule Take 20 mg by mouth daily.   Yes [provider]  traMADol (ULTRAM) 50 MG tablet Take 50-100 mg by mouth every 8 (eight) hours as needed for moderate pain.  02/16/18  Yes [provider]  Na Sulfate-K Sulfate-Mg Sulf 17.5-3.13-1.6 GM/177ML SOLN Suprep-Use as directed 03/16/18   Willia Craze, NP    Family History Family History  Problem Relation Age of Onset  . Hepatitis B Father   . Cirrhosis Father   . Colon cancer Maternal Grandfather 12  . Pancreatic cancer Maternal Grandmother   . Colon cancer Cousin 38    Social History Social History   Tobacco Use  . Smoking status: Former Research scientist (life sciences)  . Smokeless tobacco: Never Used  Substance Use Topics  . Alcohol use: Not on file    Comment: occasionally  . Drug use: No     Allergies   Patient has no known allergies.  Review of Systems Review of Systems  Constitutional: Positive for fatigue. Negative for appetite change, chills and fever.  HENT: Negative for ear pain, rhinorrhea, sneezing and sore throat.   Eyes: Negative for photophobia and visual disturbance.  Respiratory: Negative for cough, chest tightness, shortness of breath and wheezing.   Cardiovascular: Negative for chest pain and palpitations.  Gastrointestinal: Positive for abdominal pain, constipation, nausea and vomiting. Negative for blood in stool and diarrhea.  Genitourinary: Negative for dysuria, hematuria and urgency.   Musculoskeletal: Negative for myalgias.  Skin: Negative for rash.  Neurological: Negative for dizziness, weakness and light-headedness.     Physical Exam Updated Vital Signs BP (!) 183/103   Pulse 93   Temp 98.3 F (36.8 C) (Oral)   Resp 15   Ht 5\' 7"  (1.702 m)   Wt 59.4 kg (131 lb)   SpO2 100%   BMI 20.52 kg/m   Physical Exam  Constitutional: He appears well-developed and well-nourished. No distress.  NAD.  HENT:  Head: Normocephalic and atraumatic.  Nose: Nose normal.  Dry mucous membranes.  Eyes: Conjunctivae and EOM are normal. Left eye exhibits no discharge. No scleral icterus.  Neck: Normal range of motion. Neck supple.  Cardiovascular: Normal rate, regular rhythm, normal heart sounds and intact distal pulses. Exam reveals no gallop and no friction rub.  No murmur heard. Pulmonary/Chest: Effort normal and breath sounds normal. No respiratory distress.  Abdominal: Soft. Bowel sounds are normal. He exhibits no distension. There is generalized tenderness and tenderness in the left lower quadrant. There is no rebound and no guarding.  Musculoskeletal: Normal range of motion. He exhibits no edema.  Neurological: He is alert. He exhibits normal muscle tone. Coordination normal.  Skin: Skin is warm and dry. No rash noted.  Psychiatric: He has a normal mood and affect.  Nursing note and vitals reviewed.    ED Treatments / Results  Labs (all labs ordered are listed, but only abnormal results are displayed) Labs Reviewed  COMPREHENSIVE METABOLIC PANEL - Abnormal; Notable for the following components:      Result Value   Potassium 3.2 (*)    Chloride 97 (*)    Glucose, Bld 109 (*)    All other components within normal limits  CBC WITH DIFFERENTIAL/PLATELET - Abnormal; Notable for the following components:   WBC 11.8 (*)    Hemoglobin 12.9 (*)    HCT 38.2 (*)    Platelets 493 (*)    Neutro Abs 9.3 (*)    All other components within normal limits  URINALYSIS,  ROUTINE W REFLEX MICROSCOPIC - Abnormal; Notable for the following components:   APPearance CLOUDY (*)    All other components within normal limits  LIPASE, BLOOD  RAPID URINE DRUG SCREEN, HOSP PERFORMED  RAPID URINE DRUG SCREEN, HOSP PERFORMED  MAGNESIUM  TROPONIN I  CK  I-STAT CG4 LACTIC ACID, ED    EKG None  Radiology Ct Abdomen Pelvis W Contrast  Result Date: 03/22/2018 CLINICAL DATA:  Acute generalized abdominal pain. EXAM: CT ABDOMEN AND PELVIS WITH CONTRAST TECHNIQUE: Multidetector CT imaging of the abdomen and pelvis was performed using the standard protocol following bolus administration of intravenous contrast. CONTRAST:  157mL ISOVUE-300 IOPAMIDOL (ISOVUE-300) INJECTION 61% COMPARISON:  CT scan of August 19, 2015. FINDINGS: Lower chest: No acute abnormality. Hepatobiliary: No focal liver abnormality is seen. No gallstones, gallbladder wall thickening, or biliary dilatation. Pancreas: Unremarkable. No pancreatic ductal dilatation or surrounding inflammatory changes. Spleen: Normal in size without focal abnormality. Adrenals/Urinary Tract:  Adrenal glands appear normal. Small right renal calculus is noted. No hydronephrosis or renal obstruction is noted. Urinary bladder is unremarkable. Stomach/Bowel: The stomach appears normal. There is no evidence of bowel obstruction or inflammation. Diverticulosis of descending and sigmoid colon is noted without inflammation. Vascular/Lymphatic: Aortic atherosclerosis. No enlarged abdominal or pelvic lymph nodes. Reproductive: Prostate is unremarkable. Other: No abdominal wall hernia or abnormality. No abdominopelvic ascites. Musculoskeletal: No acute or significant osseous findings. IMPRESSION: Diverticulosis of descending and sigmoid colon without inflammation. Nonobstructive right nephrolithiasis. No acute abnormality is noted in the abdomen or pelvis. Aortic Atherosclerosis (ICD10-I70.0). Electronically Signed   By: Marijo Conception, M.D.   On:  03/22/2018 15:32    Procedures Procedures (including critical care time)  Medications Ordered in ED Medications  sodium chloride 0.9 % bolus 1,000 mL (0 mLs Intravenous Stopped 03/22/18 1514)  morphine 4 MG/ML injection 4 mg (4 mg Intravenous Given 03/22/18 1334)  iopamidol (ISOVUE-300) 61 % injection 100 mL (100 mLs Intravenous Contrast Given 03/22/18 1507)  famotidine (PEPCID) IVPB 20 mg premix (0 mg Intravenous Stopped 03/22/18 1646)  sodium chloride 0.9 % bolus 500 mL (0 mLs Intravenous Stopped 03/22/18 1813)  ketorolac (TORADOL) 30 MG/ML injection 30 mg (30 mg Intravenous Given 03/22/18 1558)  ondansetron (ZOFRAN) injection 4 mg (4 mg Intravenous Given 03/22/18 1652)  lisinopril (PRINIVIL,ZESTRIL) tablet 20 mg (20 mg Oral Given 03/22/18 1652)  potassium chloride SA (K-DUR,KLOR-CON) CR tablet 40 mEq (40 mEq Oral Given 03/22/18 1652)  gi cocktail (Maalox,Lidocaine,Donnatal) (30 mLs Oral Given 03/22/18 1835)     Initial Impression / Assessment and Plan / ED Course  I have reviewed the triage vital signs and the nursing notes.  Pertinent labs & imaging results that were available during my care of the patient were reviewed by me and considered in my medical decision making (see chart for details).     62 year old male with a past medical history of hypertension, GERD, diverticulosis presents for 6-day history of generalized abdominal pain more severe in the left lower quadrant, several episodes of nonbloody, nonbilious emesis.  He also reports decrease in bowel movements.  Has not been able to keep any of his home medications down without vomiting.  Scheduled for a colonoscopy on July 15.  Denies any back pain, chest pain, shortness of breath, fever, hematemesis, hematochezia or melena.  On his initial exam patient had left lower quadrant abdominal tenderness to palpation with no rebound or guarding noted.  He did have some dry mucous membranes.  Lab work including CMP, CBC, urinalysis, lipase unremarkable.   Patient denies any drug or alcohol use.  CT abdomen pelvis shows diverticulosis without inflammation.  No acute abnormality noted.  Patient was given several rounds of antacids, anti-inflammatories, morphine, fluids but still having persistent nausea, vomiting and pain.  Spoke to hospitalist who will admit for ongoing symptoms.  Asked that we obtain a UDS, lactic.  Portions of this note were generated with Lobbyist. Dictation errors may occur despite best attempts at proofreading.   Final Clinical Impressions(s) / ED Diagnoses   Final diagnoses:  None    ED Discharge Orders    None       Delia Heady, PA-C 03/22/18 2121    Dorie Rank, MD 03/23/18 [phone]

## 2018-03-22 NOTE — ED Triage Notes (Signed)
Pt arrives via EMS from home. Pt reports abdominal pain, N/V intermittently since Wednesday. Pt has not been able to take medications since Wednesday. Pt has colonoscopy scheduled on 7/15

## 2018-03-22 NOTE — ED Notes (Signed)
ED TO INPATIENT HANDOFF REPORT  Name/Age/Gender Keith Ramos 62 y.o. male  Code Status   Home/SNF/Other Home  Chief Complaint abdominal pain nausea/emesis  Level of Care/Admitting Diagnosis ED Disposition    ED Disposition Condition [facility]: [place] [[id number]]  Level of Care: Telemetry [5]  Admit to tele based on following criteria: Other see comments  Comments: hypokalemia  Diagnosis: Nausea and vomiting [951884]  Admitting Physician: Toy Baker [[id number]]  Attending Physician: Toy Baker [[id number]]  PT Class (Do Not Modify): Observation [104]  PT Acc Code (Do Not Modify): Observation [10022]       Medical History Past Medical History:  Diagnosis Date  . Hypertension     Allergies No Known Allergies  IV Location/Drains/Wounds Patient Lines/Drains/Airways Status   Active Line/Drains/Airways    Name:   Placement date:   Placement time:   Site:   Days:   Peripheral IV 03/22/18 Left Antecubital   03/22/18    1241    Antecubital   less than 1          Labs/Imaging Results for orders placed or performed during the hospital encounter of 03/22/18 (from the past 48 hour(s))  Comprehensive metabolic panel     Status: Abnormal   Collection Time: 03/22/18  1:36 PM  Result Value Ref Range   Sodium 136 135 - 145 mmol/L   Potassium 3.2 (L) 3.5 - 5.1 mmol/L   Chloride 97 (L) 98 - 111 mmol/L    Comment: Please note change in reference range.   CO2 29 22 - 32 mmol/L   Glucose, Bld 109 (H) 70 - 99 mg/dL    Comment: Please note change in reference range.   BUN 16 8 - 23 mg/dL    Comment: Please note change in reference range.   Creatinine, Ser 0.86 0.61 - 1.24 mg/dL   Calcium 9.2 8.9 - 10.3 mg/dL   Total Protein 7.2 6.5 - 8.1 g/dL   Albumin 3.5 3.5 - 5.0 g/dL   AST 18 15 - 41 U/L   ALT 23 0 - 44 U/L    Comment: Please note change in reference range.   Alkaline Phosphatase 84 38 - 126 U/L   Total Bilirubin 0.4  0.3 - 1.2 mg/dL   GFR calc non Af Amer >60 >60 mL/min   GFR calc Af Amer >60 >60 mL/min    Comment: (NOTE) The eGFR has been calculated using the CKD EPI equation. This calculation has not been validated in all clinical situations. eGFR's persistently <60 mL/min signify possible Chronic Kidney Disease.    Anion gap 10 5 - 15    Comment: Performed at [facility], [place] [address] [place], [place] [address]  CBC with Differential     Status: Abnormal   Collection Time: 03/22/18  1:36 PM  Result Value Ref Range   WBC 11.8 (H) 4.0 - 10.5 K/uL   RBC 4.57 4.22 - 5.81 MIL/uL   Hemoglobin 12.9 (L) 13.0 - 17.0 g/dL   HCT 38.2 (L) 39.0 - 52.0 %   MCV 83.6 78.0 - 100.0 fL   MCH 28.2 26.0 - 34.0 pg   MCHC 33.8 30.0 - 36.0 g/dL   RDW 13.0 11.5 - 15.5 %   Platelets 493 (H) 150 - 400 K/uL   Neutrophils Relative % 79 %   Neutro Abs 9.3 (H) 1.7 - 7.7 K/uL   Lymphocytes Relative 14 %   Lymphs Abs 1.7 0.7 -  4.0 K/uL   Monocytes Relative 7 %   Monocytes Absolute 0.8 0.1 - 1.0 K/uL   Eosinophils Relative 0 %   Eosinophils Absolute 0.0 0.0 - 0.7 K/uL   Basophils Relative 0 %   Basophils Absolute 0.0 0.0 - 0.1 K/uL    Comment: Performed at [facility], [place] [address] [place], [place] [address]  Lipase, blood     Status: None   Collection Time: 03/22/18  1:36 PM  Result Value Ref Range   Lipase 23 11 - 51 U/L    Comment: Performed at [facility], [place] [address] [place], [place] [address]  Urinalysis, Routine w reflex microscopic     Status: Abnormal   Collection Time: 03/22/18  2:32 PM  Result Value Ref Range   Color, Urine YELLOW YELLOW   APPearance CLOUDY (A) CLEAR   Specific Gravity, Urine 1.013 1.005 - 1.030   pH 8.0 5.0 - 8.0   Glucose, UA NEGATIVE NEGATIVE mg/dL   Hgb urine dipstick NEGATIVE NEGATIVE   Bilirubin Urine NEGATIVE NEGATIVE   Ketones, ur NEGATIVE NEGATIVE mg/dL   Protein, ur NEGATIVE NEGATIVE mg/dL   Nitrite  NEGATIVE NEGATIVE   Leukocytes, UA NEGATIVE NEGATIVE    Comment: Performed at [facility] [address], [place], [place] [address]  Urine rapid drug screen (hosp performed)     Status: Abnormal   Collection Time: 03/22/18  2:32 PM  Result Value Ref Range   Opiates POSITIVE (A) NONE DETECTED   Cocaine NONE DETECTED NONE DETECTED   Benzodiazepines NONE DETECTED NONE DETECTED   Amphetamines NONE DETECTED NONE DETECTED   Tetrahydrocannabinol POSITIVE (A) NONE DETECTED   Barbiturates (A) NONE DETECTED    Result not available. Reagent lot number recalled by manufacturer.    Comment: Performed at [facility], [place] [address] [place],  [address]  I-Stat CG4 Lactic Acid, ED     Status: None   Collection Time: 03/22/18  8:14 PM  Result Value Ref Range   Lactic Acid, Venous 0.89 0.5 - 1.9 mmol/L   Ct Abdomen Pelvis W Contrast  Result Date: 03/22/2018 CLINICAL DATA:  Acute generalized abdominal pain. EXAM: CT ABDOMEN AND PELVIS WITH CONTRAST TECHNIQUE: Multidetector CT imaging of the abdomen and pelvis was performed using the standard protocol following bolus administration of intravenous contrast. CONTRAST:  151m ISOVUE-300 IOPAMIDOL (ISOVUE-300) INJECTION 61% COMPARISON:  CT scan of August 19, 2015. FINDINGS: Lower chest: No acute abnormality. Hepatobiliary: No focal liver abnormality is seen. No gallstones, gallbladder wall thickening, or biliary dilatation. Pancreas: Unremarkable. No pancreatic ductal dilatation or surrounding inflammatory changes. Spleen: Normal in size without focal abnormality. Adrenals/Urinary Tract: Adrenal glands appear normal. Small right renal calculus is noted. No hydronephrosis or renal obstruction is noted. Urinary bladder is unremarkable. Stomach/Bowel: The stomach appears normal. There is no evidence of bowel obstruction or inflammation. Diverticulosis of descending and sigmoid colon is noted without inflammation.  Vascular/Lymphatic: Aortic atherosclerosis. No enlarged abdominal or pelvic lymph nodes. Reproductive: Prostate is unremarkable. Other: No abdominal wall hernia or abnormality. No abdominopelvic ascites. Musculoskeletal: No acute or significant osseous findings. IMPRESSION: Diverticulosis of descending and sigmoid colon without inflammation. Nonobstructive right nephrolithiasis. No acute abnormality is noted in the abdomen or pelvis. Aortic Atherosclerosis (ICD10-I70.0). Electronically Signed   By: JMarijo Conception M.D.   On: 03/22/2018 15:32    Pending Labs Unresulted Labs (From admission, onward)   Start     Ordered   03/22/18 2028  Gastrointestinal Panel by PCR , Stool  (Gastrointestinal Panel by PCR, Stool)  Once,   R     03/22/18 2027   03/22/18 2025  Sedimentation rate  Add-on,   R     03/22/18 2024   03/22/18 2025  Troponin I (q 6hr x 3)  Now then every 6 hours,   R     03/22/18 2024   03/22/18 1939  CK  Add-on,   R     03/22/18 1938   03/22/18 1938  Troponin I  Add-on,   R     03/22/18 1937   03/22/18 1937  Magnesium  Add-on,   R     03/22/18 1936   Signed and Held  HIV antibody (Routine Testing)  Tomorrow morning,   R     Signed and Held   Signed and Held  Magnesium  Tomorrow morning,   R    Comments:  Call MD if <1.5    Signed and Held   Signed and Held  Phosphorus  Tomorrow morning,   R     Signed and Held   Signed and Held  TSH  Once,   R    Comments:  Cancel if already done within 1 month and notify MD    Signed and Held   Signed and Held  Comprehensive metabolic panel  Once,   R    Comments:  Cal MD for K<3.5 or >5.0    Signed and Held   Signed and Held  CBC  Once,   R    Comments:  Call for hg <8.0    Signed and Held      Vitals/Pain Today's Vitals   03/22/18 1829 03/22/18 1904 03/22/18 1928 03/22/18 2016  BP: (!) 177/91 (!) 183/103  106/89  Pulse: 81 93  81  Resp: _0 Temp:      TempSrc:      SpO2: 98% 100%  98%  Weight:   131 lb (59.4 kg)    Height:   _1  (1.702 m)   PainSc:        Isolation Precautions Enteric precautions (UV disinfection)  Medications Medications  sodium chloride 0.9 % bolus 1,000 mL (0 mLs Intravenous Stopped 03/22/18 1514)  morphine 4 MG/ML injection 4 mg (4 mg Intravenous Given 03/22/18 1334)  iopamidol (ISOVUE-300) 61 % injection 100 mL (100 mLs Intravenous Contrast Given 03/22/18 1507)  famotidine (PEPCID) IVPB 20 mg premix (0 mg Intravenous Stopped 03/22/18 1646)  sodium chloride 0.9 % bolus 500 mL (0 mLs Intravenous Stopped 03/22/18 1813)  ketorolac (TORADOL) 30 MG/ML injection 30 mg (30 mg Intravenous Given 03/22/18 1558)  ondansetron (ZOFRAN) injection 4 mg (4 mg Intravenous Given 03/22/18 1652)  lisinopril (PRINIVIL,ZESTRIL) tablet 20 mg (20 mg Oral Given 03/22/18 1652)  potassium chloride SA (K-DUR,KLOR-CON) CR tablet 40 mEq (40 mEq Oral Given 03/22/18 1652)  gi cocktail (Maalox,Lidocaine,Donnatal) (30 mLs Oral Given 03/22/18 1835)    Mobility walks

## 2018-03-22 NOTE — H&P (Signed)
Keith Ramos GQQ:[id number] DOB: 09-21-1956 DOA: 03/22/2018     PCP: Keith Billet, MD   [facility]   [facility]: [facility] Patient arrived to ER on 03/22/18 at 1230  Patient coming from:   home Lives alone,     Chief Complaint:  Chief Complaint  Patient presents with  . Abdominal Pain  . Emesis  . Nausea    HPI: Keith Ramos is a 62 y.o. male with medical history significant of HTN, diverticulosis, GERD    Presented with Has not felt well since end of May after getting dehydrated from going on a bike run but for the past 6 days of  abdominal pain in left lower quadrant , nausea and  Vomiting. He ate something on Wednesday reports ate some old pizza that he feels have set this off. He was feeling a bit better yesterday but then developed an other episode of vomiting. Denies sick contacts. Denies tobacco abuse occasional drink no drug use.  Specifically denies marijuana abuse. reports He has been loosing weight. Reports he usually is very active.  Reports occasional chills. Reports low grade fever States have not been able to tolerate his medications.  Emesis has been nonbloody no associated diarrhea.  Last bowel movement was almost 4 days ago but did have  Small one this AM.  Attempted to use Dramamine to see help but that did not seem to help. No associated chest pain or shortness of breath back pain   Recently seen by GI due to development of mild normocytic anemia and 18 pound weight loss last colonoscopy was in 2008 he is scheduled to have colonoscopy in the next 2 weeks  Also endorsing generalized malaise as well as left shoulder pain which she feels secondary to bicep been torn Regarding pertinent Chronic problems: Story of hypertension on Norvasc   While in ER: Distant abdominal pain nausea and vomiting despite supportive management negative CT Noted to be's persistently hypertensive in 180s Following Medications were ordered in ER: Medications  sodium chloride 0.9 % bolus  1,000 mL (0 mLs Intravenous Stopped 03/22/18 1514)  morphine 4 MG/ML injection 4 mg (4 mg Intravenous Given 03/22/18 1334)  iopamidol (ISOVUE-300) 61 % injection 100 mL (100 mLs Intravenous Contrast Given 03/22/18 1507)  famotidine (PEPCID) IVPB 20 mg premix (0 mg Intravenous Stopped 03/22/18 1646)  sodium chloride 0.9 % bolus 500 mL (0 mLs Intravenous Stopped 03/22/18 1813)  ketorolac (TORADOL) 30 MG/ML injection 30 mg (30 mg Intravenous Given 03/22/18 1558)  ondansetron (ZOFRAN) injection 4 mg (4 mg Intravenous Given 03/22/18 1652)  lisinopril (PRINIVIL,ZESTRIL) tablet 20 mg (20 mg Oral Given 03/22/18 1652)  potassium chloride SA (K-DUR,KLOR-CON) CR tablet 40 mEq (40 mEq Oral Given 03/22/18 1652)  gi cocktail (Maalox,Lidocaine,Donnatal) (30 mLs Oral Given 03/22/18 1835)    Significant initial  Findings: Abnormal Labs Reviewed  COMPREHENSIVE METABOLIC PANEL - Abnormal; Notable for the following components:      Result Value   Potassium 3.2 (*)    Chloride 97 (*)    Glucose, Bld 109 (*)    All other components within normal limits  CBC WITH DIFFERENTIAL/PLATELET - Abnormal; Notable for the following components:   WBC 11.8 (*)    Hemoglobin 12.9 (*)    HCT 38.2 (*)    Platelets 493 (*)    Neutro Abs 9.3 (*)    All other components within normal limits  URINALYSIS, ROUTINE W REFLEX MICROSCOPIC - Abnormal; Notable for the following components:  APPearance CLOUDY (*)    All other components within normal limits   Lipase 23  Na 136    Cr   Up from baseline see below Lab Results  Component Value Date   CREATININE 0.86 03/22/2018   CREATININE 0.58 (L) 02/21/2018   CREATININE 0.80 08/19/2015      WBC  11.8  HG/HCT  stable,      Component Value Date/Time   HGB 12.9 (L) 03/22/2018 1336   HCT 38.2 (L) 03/22/2018 1336   U tox positive for white marijuana and opioids but morphine has been given in the emergency department   UA  no evidence of UTI      CTabd/pelvis -  nonacute  ECG:  EKG  showing sinus rhythm heart rate 83 minimal ST segment depression in lateral leads with QTC of 454   ED Triage Vitals  Enc Vitals Group     BP 03/22/18 1236 (!) 194/97     Pulse Rate 03/22/18 1236 91     Resp 03/22/18 1236 16     Temp 03/22/18 1236 98.3 F (36.8 C)     Temp Source 03/22/18 1236 Oral     SpO2 03/22/18 1236 97 %     Weight 03/22/18 1928 131 lb (59.4 kg)     Height 03/22/18 1928 5\' 7"  (1.702 m)     Head Circumference --      Peak Flow --      Pain Score 03/22/18 1240 6     Pain Loc --      Pain Edu? --      Excl. in [place]? --   TMAX(24)@       Latest  Blood pressure (!) 183/103, pulse 93, temperature 98.3 F (36.8 C), temperature source Oral, resp. rate 15, height 5\' 7"  (1.702 m), weight 59.4 kg (131 lb), SpO2 100 %.    Hospitalist was called for admission for intractable nausea and vomiting and abdominal pain with unremarkable CT   Review of Systems:    Pertinent positives include: , abdominal pain, nausea, vomiting,  Constitutional:  No weight loss, night sweats, Fevers, chills, fatigue, weight loss  HEENT:  No headaches, Difficulty swallowing,Tooth/dental problems,Sore throat,  No sneezing, itching, ear ache, nasal congestion, post nasal drip,  Cardio-vascular:  No chest pain, Orthopnea, PND, anasarca, dizziness, palpitations.no Bilateral lower extremity swelling  GI:  No heartburn, indigestion diarrhea, change in bowel habits, loss of appetite, melena, blood in stool, hematemesis Resp:  no shortness of breath at rest. No dyspnea on exertion, No excess mucus, no productive cough, No non-productive cough, No coughing up of blood.No change in color of mucus.No wheezing. Skin:  no rash or lesions. No jaundice GU:  no dysuria, change in color of urine, no urgency or frequency. No straining to urinate.  No flank pain.  Musculoskeletal:  No joint pain or no joint swelling. No decreased range of motion. No back pain.  Psych:  No change in mood or affect. No  depression or anxiety. No memory loss.  Neuro: no localizing neurological complaints, no tingling, no weakness, no double vision, no gait abnormality, no slurred speech, no confusion  As per HPI otherwise 10 point review of systems negative.   Past Medical History:   Past Medical History:  Diagnosis Date  . Hypertension       Past Surgical History:  Procedure Laterality Date  . HERNIA REPAIR  1977   62 yrs old multiple times since  . KNEE ARTHROSCOPY Left 2007  Social History:  Ambulatory   independently     reports that he has quit smoking. He has never used smokeless tobacco. He reports that he does not use drugs. His alcohol history is not on file.     Family History:   Family History  Problem Relation Age of Onset  . Hepatitis B Father   . Cirrhosis Father   . Colon cancer Maternal Grandfather 67  . Pancreatic cancer Maternal Grandmother   . Colon cancer Cousin 38    Allergies: No Known Allergies   Prior to Admission medications   Medication Sig Start Date End Date Taking? Authorizing Provider  amLODipine (NORVASC) 5 MG tablet Take 5 mg by mouth daily.   Yes [provider]  dimenhyDRINATE (DRAMAMINE PO) Take 1 tablet by mouth daily as needed (nausea).   Yes [provider]  LIDOCAINE EX Apply 1 application topically daily as needed (pain).   Yes [provider]  lisinopril (PRINIVIL,ZESTRIL) 20 MG tablet Take 20 mg by mouth 2 (two) times daily.    Yes [provider]  omeprazole (PRILOSEC) 20 MG capsule Take 20 mg by mouth daily.   Yes [provider]  traMADol (ULTRAM) 50 MG tablet Take 50-100 mg by mouth every 8 (eight) hours as needed for moderate pain.  02/16/18  Yes [provider]  Na Sulfate-K Sulfate-Mg Sulf 17.5-3.13-1.6 GM/177ML SOLN Suprep-Use as directed 03/16/18   Willia Craze, NP   Physical Exam: Blood pressure (!) 183/103, pulse 93, temperature 98.3 F (36.8 C), temperature source Oral,  resp. rate 15, height 5\' 7"  (1.702 m), weight 59.4 kg (131 lb), SpO2 100 %. 1. General:  in No Acute distress  Chronically ill  -appearing 2. Psychological: Alert and  Oriented 3. Head/ENT:     Dry Mucous Membranes                          Head Non traumatic, neck supple                            Poor Dentition 4. SKIN: decreased Skin turgor,  Skin clean Dry and intact no rash 5. Heart: Regular rate and rhythm no Murmur, no Rub or gallop 6. Lungs:   no wheezes or crackles   7. Abdomen: Soft,  non-tender, Non distended bowel sounds present 8. Lower extremities: no clubbing, cyanosis, or edema 9. Neurologically Grossly intact, moving all 4 extremities equally   10. MSK: Normal range of motion   LABS:     Recent Labs  Lab 03/22/18 1336  WBC 11.8*  NEUTROABS 9.3*  HGB 12.9*  HCT 38.2*  MCV 83.6  PLT 299*   Basic Metabolic Panel: Recent Labs  Lab 03/22/18 1336  NA 136  K 3.2*  CL 97*  CO2 29  GLUCOSE 109*  BUN 16  CREATININE 0.86  CALCIUM 9.2      Recent Labs  Lab 03/22/18 1336  AST 18  ALT 23  ALKPHOS 84  BILITOT 0.4  PROT 7.2  ALBUMIN 3.5   Recent Labs  Lab 03/22/18 1336  LIPASE 23   No results for input(s): AMMONIA in the last 168 hours.    HbA1C: No results for input(s): HGBA1C in the last 72 hours. CBG: No results for input(s): GLUCAP in the last 168 hours.    Urine analysis:    Component Value Date/Time   COLORURINE YELLOW 03/22/2018 1432  APPEARANCEUR CLOUDY (A) 03/22/2018 1432   LABSPEC 1.013 03/22/2018 1432   PHURINE 8.0 03/22/2018 1432   GLUCOSEU NEGATIVE 03/22/2018 1432   HGBUR NEGATIVE 03/22/2018 1432   BILIRUBINUR NEGATIVE 03/22/2018 1432   KETONESUR NEGATIVE 03/22/2018 1432   PROTEINUR NEGATIVE 03/22/2018 1432   NITRITE NEGATIVE 03/22/2018 1432   LEUKOCYTESUR NEGATIVE 03/22/2018 1432       Cultures: No results found for: SDES, SPECREQUEST, CULT, REPTSTATUS   Radiological Exams on Admission: Ct Abdomen Pelvis W  Contrast  Result Date: 03/22/2018 CLINICAL DATA:  Acute generalized abdominal pain. EXAM: CT ABDOMEN AND PELVIS WITH CONTRAST TECHNIQUE: Multidetector CT imaging of the abdomen and pelvis was performed using the standard protocol following bolus administration of intravenous contrast. CONTRAST:  179mL ISOVUE-300 IOPAMIDOL (ISOVUE-300) INJECTION 61% COMPARISON:  CT scan of August 19, 2015. FINDINGS: Lower chest: No acute abnormality. Hepatobiliary: No focal liver abnormality is seen. No gallstones, gallbladder wall thickening, or biliary dilatation. Pancreas: Unremarkable. No pancreatic ductal dilatation or surrounding inflammatory changes. Spleen: Normal in size without focal abnormality. Adrenals/Urinary Tract: Adrenal glands appear normal. Small right renal calculus is noted. No hydronephrosis or renal obstruction is noted. Urinary bladder is unremarkable. Stomach/Bowel: The stomach appears normal. There is no evidence of bowel obstruction or inflammation. Diverticulosis of descending and sigmoid colon is noted without inflammation. Vascular/Lymphatic: Aortic atherosclerosis. No enlarged abdominal or pelvic lymph nodes. Reproductive: Prostate is unremarkable. Other: No abdominal wall hernia or abnormality. No abdominopelvic ascites. Musculoskeletal: No acute or significant osseous findings. IMPRESSION: Diverticulosis of descending and sigmoid colon without inflammation. Nonobstructive right nephrolithiasis. No acute abnormality is noted in the abdomen or pelvis. Aortic Atherosclerosis (ICD10-I70.0). Electronically Signed   By: Marijo Conception, M.D.   On: 03/22/2018 15:32    Chart has been reviewed    Assessment/Plan 62 y.o. male with medical history significant of HTN, diverticulosis, GERD     Admitted for intractable nausea and vomiting and abdominal pain with unremarkable CT   Present on Admission: . Abdominal pain -somewhat atypical presentation possibly secondary to gastroenteritis CT of  abdomen unremarkable lactic acid within normal limits. Patient does endorse weight loss and generalized myalgia.  Has reported exposure to ticks Check sed rate for completion can check for Lyme serologies although much less common in [place] . Hypokalemia will replace and check magnesium . Nausea and vomiting currently somewhat improving CT of abdomen unremarkable no neurological complaints neurologically intact.  Suspect gastroenteritis versus marijuana use induced . HYPERTENSION, BENIGN ESSENTIAL, LABILE soft blood pressures currently although were elevated initially.  Resume home medications when stable Abnormal EKG monitor on telemetry cycle cardiac enzymes Myalgias and weight loss will need further work-up start with sed rate would benefit from further outpatient work-up   Other plan as per orders.  DVT prophylaxis:  SCD    Code Status:  FULL CODE  as per patient   I had personally discussed CODE STATUS with patient    Family Communication:   Family not at  Bedside     Disposition Plan:    To home once workup is complete and patient is stable                         Consults called: none  Admission status:  obs   Level of care   tele          Toy Baker 03/22/2018, 8:35 PM    [facility]  Pager [phone]   after 2 AM please page  floor coverage PA If 7AM-7PM, please contact the day team taking care of the patient  [email or web address]  Password [email or web address]

## 2018-03-22 NOTE — ED Triage Notes (Signed)
EMS reports nausea vomiting and abdominal pain since last Wednesday. Seen Monday at [facility], scheduled colonoscopy for 7/15  20ga LAC 4mg  Zofran enroute

## 2018-03-23 DIAGNOSIS — R112 Nausea with vomiting, unspecified: Secondary | ICD-10-CM | POA: Diagnosis present

## 2018-03-23 DIAGNOSIS — M791 Myalgia, unspecified site: Secondary | ICD-10-CM

## 2018-03-23 DIAGNOSIS — R531 Weakness: Secondary | ICD-10-CM

## 2018-03-23 DIAGNOSIS — K219 Gastro-esophageal reflux disease without esophagitis: Secondary | ICD-10-CM

## 2018-03-23 LAB — GASTROINTESTINAL PANEL BY PCR, STOOL (REPLACES STOOL CULTURE)
Adenovirus F40/41: NOT DETECTED
Astrovirus: NOT DETECTED
Campylobacter species: NOT DETECTED
Cryptosporidium: NOT DETECTED
Cyclospora cayetanensis: NOT DETECTED
ENTEROAGGREGATIVE E COLI (EAEC): NOT DETECTED
Entamoeba histolytica: NOT DETECTED
Enteropathogenic E coli (EPEC): DETECTED — AB
Enterotoxigenic E coli (ETEC): NOT DETECTED
Giardia lamblia: NOT DETECTED
NOROVIRUS GI/GII: NOT DETECTED
Plesimonas shigelloides: NOT DETECTED
Rotavirus A: NOT DETECTED
SALMONELLA SPECIES: NOT DETECTED
SHIGELLA/ENTEROINVASIVE E COLI (EIEC): NOT DETECTED
Sapovirus (I, II, IV, and V): NOT DETECTED
Shiga like toxin producing E coli (STEC): NOT DETECTED
Vibrio cholerae: NOT DETECTED
Vibrio species: NOT DETECTED
YERSINIA ENTEROCOLITICA: NOT DETECTED

## 2018-03-23 LAB — CBC
HCT: 33.4 % — ABNORMAL LOW (ref 39.0–52.0)
Hemoglobin: 11.2 g/dL — ABNORMAL LOW (ref 13.0–17.0)
MCH: 28.7 pg (ref 26.0–34.0)
MCHC: 33.5 g/dL (ref 30.0–36.0)
MCV: 85.6 fL (ref 78.0–100.0)
PLATELETS: 371 10*3/uL (ref 150–400)
RBC: 3.9 MIL/uL — ABNORMAL LOW (ref 4.22–5.81)
RDW: 13 % (ref 11.5–15.5)
WBC: 7.7 10*3/uL (ref 4.0–10.5)

## 2018-03-23 LAB — TROPONIN I: Troponin I: <0.03 ng/mL (ref <0.03)

## 2018-03-23 LAB — PHOSPHORUS: Phosphorus: 3.7 mg/dL (ref 2.5–4.6)

## 2018-03-23 LAB — COMPREHENSIVE METABOLIC PANEL
ALK PHOS: 67 U/L (ref 38–126)
ALT: 21 U/L (ref 0–44)
AST: 17 U/L (ref 15–41)
Albumin: 2.9 g/dL — ABNORMAL LOW (ref 3.5–5.0)
Anion gap: 9 (ref 5–15)
BILIRUBIN TOTAL: 0.5 mg/dL (ref 0.3–1.2)
BUN: 16 mg/dL (ref 8–23)
CALCIUM: 8.9 mg/dL (ref 8.9–10.3)
CO2: 26 mmol/L (ref 22–32)
CREATININE: 0.88 mg/dL (ref 0.61–1.24)
Chloride: 105 mmol/L (ref 98–111)
Glucose, Bld: 88 mg/dL (ref 70–99)
Potassium: 4.1 mmol/L (ref 3.5–5.1)
Sodium: 140 mmol/L (ref 135–145)
TOTAL PROTEIN: 6.2 g/dL — AB (ref 6.5–8.1)

## 2018-03-23 LAB — MAGNESIUM: MAGNESIUM: 2 mg/dL (ref 1.7–2.4)

## 2018-03-23 LAB — TSH: TSH: 0.922 u[IU]/mL (ref 0.350–4.500)

## 2018-03-23 LAB — HIV ANTIBODY (ROUTINE TESTING W REFLEX): HIV SCREEN 4TH GENERATION: NONREACTIVE

## 2018-03-23 MED ORDER — ORAL CARE MOUTH RINSE
15.0000 mL | Freq: Two times a day (BID) | OROMUCOSAL | Status: DC
  Administered 2018-03-23: 15 mL via OROMUCOSAL

## 2018-03-23 MED ORDER — ENSURE ENLIVE PO LIQD
237.0000 mL | Freq: Two times a day (BID) | ORAL | Status: DC
  Administered 2018-03-23 – 2018-03-24 (×3): 237 mL via ORAL

## 2018-03-23 MED ORDER — TAMSULOSIN HCL 0.4 MG PO CAPS
0.4000 mg | ORAL_CAPSULE | Freq: Every day | ORAL | Status: DC
  Administered 2018-03-23 – 2018-03-24 (×2): 0.4 mg via ORAL
  Filled 2018-03-23 (×2): qty 1

## 2018-03-23 MED ORDER — SODIUM CHLORIDE 0.9 % IV SOLN
INTRAVENOUS | Status: AC
Start: 2018-03-23 — End: 2018-03-24
  Administered 2018-03-24: 04:00:00 via INTRAVENOUS

## 2018-03-23 MED ORDER — SODIUM CHLORIDE 0.9 % IV SOLN
INTRAVENOUS | Status: AC
  Administered 2018-03-23 (×2): via INTRAVENOUS

## 2018-03-23 NOTE — Progress Notes (Addendum)
PROGRESS NOTE    Keith Ramos  XKG:[id number] DOB: 07-20-1956 DOA: 03/22/2018 PCP: Albina Billet, MD   Brief Narrative:  62 year old with a history of essential hypertension, diverticulosis, GERD, possible iron deficiency anemia came to the hospital who has been feeling generally weak for the past 2-3 months and has lost about 18 pounds.  But over the course of last 6 days he has had generalized abdominal pain especially in the lower abdomen along with nausea and intermittent nonbloody vomiting.  He has recently seen outpatient gastroenterology for iron deficiency anemia and is scheduled for endoscopy and a colonoscopy on July 15.  In the meantime he is admitted due to acute abdominal complaints.  CT of the abdomen pelvis is negative for any acute pathology but shows nonobstructive renal stone.   Assessment & Plan:   Active Problems:   HYPERTENSION, BENIGN ESSENTIAL, LABILE   Hypokalemia   Nausea and vomiting   Abdominal pain  Nonspecific abdominal pain with nausea and vomiting, slightly improved Generalized weakness; + orthostatis - Unknown exact etiology.  CT of the abdomen pelvis is negative for any acute abdominal pathology.  LFTs and lipase is normal.  Only nonobstructive renal stone seen in the CT -Recently seen by gastroenterology outpatient who plans on performing colonoscopy and endoscopy on July 15 -Antiemetics as needed, IV fluids.  Monitor urine output -Spoke with Nevin Bloodgood from gastroenterology who patient had seen recently outpatient, appreciate her input. -Advance diet as tolerated -he is still orthostatic this morning therefore Getting IV fluids.  Generalized weakness with myalgias - Patient has had the symptoms for several weeks now and has undergone basic work-up.  TSH is normal, CK levels are within normal limits as well.  Recent outpatient ESR and CRP were elevated.  He also reports of slight joint pain bilateral upper extremity.  Also reports mostly his weakness is in  lower extremity.  Does not have any red flag signs but eventually would benefit from MRI of his back - At this time we will get physical therapy and Occupational Therapy to see the patient -We will check ANA including some other autoimmune labs.  Would recommend follow-up outpatient with primary care physician and likely eventually with the rheumatologist.  Nonobstructive renal stone -We will continue IV fluids until he is able to fully and thoroughly able to tolerate oral diet.  We will add Flomax.  Weight loss; unintentional -Recently has lost about 20 pounds in last 8 to 12 weeks.  He needs outpatient colonoscopy and routine cancer screening.  Essential hypertension -We will resume home medications as tolerated  Questionable tick bite -States he may have seen some takes around him about 10 weeks ago but denies noticing any tick bites.  Lyme titers has been sent by admitting physician.  DVT prophylaxis: Lovenox Code Status: Full code Family Communication: Sister at bedside Disposition Plan: Maintain another day of an hospital stay for more IV fluid until his abdominal symptoms along with nausea vomiting improves.  He also needs PT/OT evaluation  Consultants:   None  Procedures:  None Antimicrobials:   None   Subjective: Does not have any complaints.  Upon further questioning he states about 2 or 3 months ago he noticed some ticks around him but without any bites.  Review of Systems Otherwise negative except as per HPI, including: General: Denies fever, chills, night sweats or unintended weight loss. Resp: Denies cough, wheezing, shortness of breath. Cardiac: Denies chest pain, palpitations, orthopnea, paroxysmal nocturnal dyspnea. GI: Deniesdiarrhea or constipation GU:  Denies dysuria, frequency, hesitancy or incontinence MS: Denies muscle aches, joint pain or swelling Neuro: Denies headache, neurologic deficits (focal weakness, numbness, tingling), abnormal gait Psych:  Denies anxiety, depression, SI/HI/AVH Skin: Denies new rashes or lesions ID: Denies sick contacts, exotic exposures, travel  Objective: Vitals:   03/22/18 2016 03/22/18 2058 03/22/18 2122 03/23/18 0451  BP: 106/89 99/67 114/67 (!) 99/58  Pulse: 81 78 71 60  Resp: '19 16 15 16  ' Temp:   98.3 F (36.8 C) 98.7 F (37.1 C)  TempSrc:   Oral Oral  SpO2: 98% 97% 100% 98%  Weight:   60.9 kg (134 lb 4.2 oz)   Height:        Intake/Output Summary (Last 24 hours) at 03/23/2018 1209 Last data filed at 03/23/2018 0850 Gross per 24 hour  Intake 3033.34 ml  Output 500 ml  Net 2533.34 ml   Filed Weights   03/22/18 1928 03/22/18 2122  Weight: 59.4 kg (131 lb) 60.9 kg (134 lb 4.2 oz)    Examination:  General exam: Appears calm and comfortable, overall weak appearing with dry mucous membrane Respiratory system: Clear to auscultation. Respiratory effort normal. Cardiovascular system: S1 & S2 heard, RRR. No JVD, murmurs, rubs, gallops or clicks. No pedal edema. Gastrointestinal system: Abdomen is nondistended, soft and nontender. No organomegaly or masses felt. Normal bowel sounds heard. Central nervous system: Alert and oriented. No focal neurological deficits. Extremities: Symmetric 4 x 5 power. Skin: No rashes, lesions or ulcers Psychiatry: Judgement and insight appear normal. Mood & affect appropriate.     Data Reviewed:   CBC: Recent Labs  Lab 03/22/18 1336 03/23/18 0254  WBC 11.8* 7.7  NEUTROABS 9.3*  --   HGB 12.9* 11.2*  HCT 38.2* 33.4*  MCV 83.6 85.6  PLT 493* 115   Basic Metabolic Panel: Recent Labs  Lab 03/22/18 1336 03/22/18 2020 03/23/18 0254  NA 136  --  140  K 3.2*  --  4.1  CL 97*  --  105  CO2 29  --  26  GLUCOSE 109*  --  88  BUN 16  --  16  CREATININE 0.86  --  0.88  CALCIUM 9.2  --  8.9  MG  --  2.3 2.0  PHOS  --   --  3.7   GFR: Estimated Creatinine Clearance: 75.9 mL/min (by C-G formula based on SCr of 0.88 mg/dL). Liver Function Tests: Recent  Labs  Lab 03/22/18 1336 03/23/18 0254  AST 18 17  ALT 23 21  ALKPHOS 84 67  BILITOT 0.4 0.5  PROT 7.2 6.2*  ALBUMIN 3.5 2.9*   Recent Labs  Lab 03/22/18 1336  LIPASE 23   No results for input(s): AMMONIA in the last 168 hours. Coagulation Profile: No results for input(s): INR, PROTIME in the last 168 hours. Cardiac Enzymes: Recent Labs  Lab 03/22/18 2020 03/23/18 0254 03/23/18 0934  CKTOTAL 44*  --   --   TROPONINI <0.03 <0.03 <0.03   BNP (last 3 results) No results for input(s): PROBNP in the last 8760 hours. HbA1C: No results for input(s): HGBA1C in the last 72 hours. CBG: No results for input(s): GLUCAP in the last 168 hours. Lipid Profile: No results for input(s): CHOL, HDL, LDLCALC, TRIG, CHOLHDL, LDLDIRECT in the last 72 hours. Thyroid Function Tests: Recent Labs    03/23/18 0254  TSH 0.922   Anemia Panel: No results for input(s): VITAMINB12, FOLATE, FERRITIN, TIBC, IRON, RETICCTPCT in the last 72 hours. Sepsis Labs: Recent Labs  Lab 03/22/18 2014  LATICACIDVEN 0.89    No results found for this or any previous visit (from the past 240 hour(s)).       Radiology Studies: Dg Chest 2 View  Result Date: 03/22/2018 CLINICAL DATA:  Abdominal pain with nausea and vomiting EXAM: CHEST - 2 VIEW COMPARISON:  None. FINDINGS: The heart size and mediastinal contours are within normal limits. Both lungs are clear. Mild wedging of approximate T12. IMPRESSION: No active cardiopulmonary disease. Electronically Signed   By: Donavan Foil M.D.   On: 03/22/2018 21:35   Ct Abdomen Pelvis W Contrast  Result Date: 03/22/2018 CLINICAL DATA:  Acute generalized abdominal pain. EXAM: CT ABDOMEN AND PELVIS WITH CONTRAST TECHNIQUE: Multidetector CT imaging of the abdomen and pelvis was performed using the standard protocol following bolus administration of intravenous contrast. CONTRAST:  14m ISOVUE-300 IOPAMIDOL (ISOVUE-300) INJECTION 61% COMPARISON:  CT scan of August 19, 2015. FINDINGS: Lower chest: No acute abnormality. Hepatobiliary: No focal liver abnormality is seen. No gallstones, gallbladder wall thickening, or biliary dilatation. Pancreas: Unremarkable. No pancreatic ductal dilatation or surrounding inflammatory changes. Spleen: Normal in size without focal abnormality. Adrenals/Urinary Tract: Adrenal glands appear normal. Small right renal calculus is noted. No hydronephrosis or renal obstruction is noted. Urinary bladder is unremarkable. Stomach/Bowel: The stomach appears normal. There is no evidence of bowel obstruction or inflammation. Diverticulosis of descending and sigmoid colon is noted without inflammation. Vascular/Lymphatic: Aortic atherosclerosis. No enlarged abdominal or pelvic lymph nodes. Reproductive: Prostate is unremarkable. Other: No abdominal wall hernia or abnormality. No abdominopelvic ascites. Musculoskeletal: No acute or significant osseous findings. IMPRESSION: Diverticulosis of descending and sigmoid colon without inflammation. Nonobstructive right nephrolithiasis. No acute abnormality is noted in the abdomen or pelvis. Aortic Atherosclerosis (ICD10-I70.0). Electronically Signed   By: JMarijo Conception M.D.   On: 03/22/2018 15:32        Scheduled Meds: . enoxaparin (LOVENOX) injection  40 mg Subcutaneous Q24H  . mouth rinse  15 mL Mouth Rinse BID  . pantoprazole  40 mg Oral Daily  . tamsulosin  0.4 mg Oral Daily   Continuous Infusions: . sodium chloride 100 mL/hr at 03/23/18 0811     LOS: 0 days    I have spent 35 minutes face to face with the patient and on the ward discussing the patients care, assessment, plan and disposition with other care givers. >50% of the time was devoted counseling the patient about the risks and benefits of treatment and coordinating care.     Danilynn Jemison CArsenio Loader MD [facility] Pager [phone]  If 7PM-7AM, please contact night-coverage [email or web address] Password [email or web address]7/10/2017, 12:09 PM

## 2018-03-23 NOTE — Evaluation (Signed)
Physical Therapy Evaluation Patient Details Name: Keith Ramos MRN: [id number] DOB: [birth year] Today's Date: 03/23/2018   History of Present Illness  62 yo male admitted with abd pain, N/V, hypokalemia, orthostatic hypotension, weakness.   Clinical Impression  On eval, pt required Min guard-Min assist for mobility. He walked ~500 feet around unit. LOB x 2 with head turns/scanning environment. Pt also c/o LE weakness and recent groin injury. Discussed possibly considering OP PT. Will continue to follow and progress activity as tolerated.     Follow Up Recommendations Outpatient PT    Equipment Recommendations  None recommended by PT    Recommendations for Other Services       Precautions / Restrictions Precautions Precautions: Fall Restrictions Weight Bearing Restrictions: No      Mobility  Bed Mobility Overal bed mobility: Independent                Transfers Overall transfer level: Independent                  Ambulation/Gait Ambulation/Gait assistance: Min guard;Min assist Gait Distance (Feet): 500 Feet Assistive device: None Gait Pattern/deviations: Drifts right/left;Staggering right;Staggering left;Step-through pattern     General Gait Details: slow gait speed. 2 instances of loss of balance with head turns. No c/o dizziness.   Stairs            Wheelchair Mobility    Modified Rankin (Stroke Patients Only)       Balance Overall balance assessment: Needs assistance           Standing balance-Leahy Scale: Fair Standing balance comment: LOB x 2 with head turns/scanning environment                             Pertinent Vitals/Pain Pain Assessment: No/denies pain    Home Living Family/patient expects to be discharged to:: Private residence Living Arrangements: Alone   Type of Home: House         Home Equipment: None      Prior Function Level of Independence: Independent               Hand Dominance        Extremity/Trunk Assessment   Upper Extremity Assessment Upper Extremity Assessment: Overall WFL for tasks assessed    Lower Extremity Assessment Lower Extremity Assessment: Generalized weakness    Cervical / Trunk Assessment Cervical / Trunk Assessment: Normal  Communication   Communication: No difficulties  Cognition Arousal/Alertness: Awake/alert Behavior During Therapy: WFL for tasks assessed/performed Overall Cognitive Status: Within Functional Limits for tasks assessed                                        General Comments      Exercises     Assessment/Plan    PT Assessment Patient needs continued PT services  PT Problem List Decreased balance;Decreased strength       PT Treatment Interventions Gait training;Functional mobility training;Therapeutic activities;Therapeutic exercise;Balance training;Patient/family education    PT Goals (Current goals can be found in the Care Plan section)  Acute Rehab PT Goals Patient Stated Goal: to get back to training. (very active lifestyle) PT Goal Formulation: With patient Time For Goal Achievement: 04/06/18 Potential to Achieve Goals: Good    Frequency Min 3X/week   Barriers to discharge        Co-evaluation  AM-PAC PT "6 Clicks" Daily Activity  Outcome Measure Difficulty turning over in bed (including adjusting bedclothes, sheets and blankets)?: None Difficulty moving from lying on back to sitting on the side of the bed? : None Difficulty sitting down on and standing up from a chair with arms (e.g., wheelchair, bedside commode, etc,.)?: None Help needed moving to and from a bed to chair (including a wheelchair)?: None Help needed walking in hospital room?: A Little Help needed climbing 3-5 steps with a railing? : A Little 6 Click Score: 22    End of Session Equipment Utilized During Treatment: Gait belt Activity Tolerance: Patient tolerated treatment well Patient left:  in bed;with call bell/phone within reach   PT Visit Diagnosis: Unsteadiness on feet (R26.81);Muscle weakness (generalized) (M62.81)    Time: 1450-1511 PT Time Calculation (min) (ACUTE ONLY): 21 min   Charges:   PT Evaluation $PT Eval Moderate Complexity: 1 Mod     PT G Codes:          [name], MPT Pager: [phone]

## 2018-03-23 NOTE — Progress Notes (Signed)
Initial Nutrition Assessment  DOCUMENTATION CODES:   Non-severe (moderate) malnutrition in context of acute illness/injury  INTERVENTION:   Ensure Enlive po BID, each supplement provides 350 kcal and 20 grams of protein  NUTRITION DIAGNOSIS:   Moderate Malnutrition related to acute illness as evidenced by energy intake < 75% for > 7 days, mild fat depletion, moderate fat depletion, moderate muscle depletion, mild muscle depletion.  GOAL:   Patient will meet greater than or equal to 90% of their needs  MONITOR:   PO intake, Supplement acceptance, Weight trends, Labs  REASON FOR ASSESSMENT:   Malnutrition Screening Tool    ASSESSMENT:   Patient with PMH significant for HTN, diverticulosis, GERD, and possible iron deficiency anemia. Presents this admission with complaints of generalized weakness and abdominal pain with nausea/vomiting.    Pt reports having a decrease in appetite 2-3 months PTA. 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 This has slowly decreased to 6-7 small meals over the last 2-3 months. States his intake decreased to Pedialyte, Gatorade, and broth one week ago due to vomiting upon anything PO. Pt had [place] with green beans this afternoon and denies any nausea/vomting. Discussed the importance of protein intake for preservation of lean body mass. Pt amendable to Ensure this stay.   Pt endorses a UBW of 160 lb the last time being at that weight in the beginning of June. Records are limited in weight history but show a stated weight of 145 lb on 03/16/18 and an actual weight of 134 lb this admission. Unable to determine actual weight loss with stated weight.   Nutrition-Focused physical exam completed. Pt reports he has noticed some muscle loss. He also points out his torn left bicep that has been used less often due to pain.    Medications reviewed.  Labs reviewed.   NUTRITION - FOCUSED PHYSICAL EXAM:    Most Recent  Value  Orbital Region  Mild depletion  Upper Arm Region  Moderate depletion  Thoracic and Lumbar Region  Moderate depletion  Buccal Region  Mild depletion  Temple Region  Moderate depletion  Clavicle Bone Region  Severe depletion  Clavicle and Acromion Bone Region  Severe depletion  Scapular Bone Region  Moderate depletion  Dorsal Hand  No depletion  Patellar Region  Mild depletion  Anterior Thigh Region  Mild depletion  Posterior Calf Region  Mild depletion  Edema (RD Assessment)  None  Hair  Reviewed  Eyes  Reviewed  Mouth  Reviewed  Skin  Reviewed  Nails  Reviewed     Diet Order:   Diet Order           DIET SOFT Room service appropriate? Yes; Fluid consistency: Thin  Diet effective now          EDUCATION NEEDS:   Education needs have been addressed  Skin:  Skin Assessment: Reviewed RN Assessment  Last BM:  03/22/18  Height:   Ht Readings from Last 1 Encounters:  03/22/18 5\' 7"  (1.702 m)    Weight:   Wt Readings from Last 1 Encounters:  03/22/18 134 lb 4.2 oz (60.9 kg)    Ideal Body Weight:  67.3 kg  BMI:  Body mass index is 21.03 kg/m.  Estimated Nutritional Needs:   Kcal:  1850-2050 kcal  Protein:  90-100 g  Fluid:  >1.8 L/day    Mariana Single RD, LDN Clinical Nutrition Pager # [phone]

## 2018-03-24 DIAGNOSIS — R509 Fever, unspecified: Secondary | ICD-10-CM

## 2018-03-24 DIAGNOSIS — E876 Hypokalemia: Secondary | ICD-10-CM

## 2018-03-24 DIAGNOSIS — R112 Nausea with vomiting, unspecified: Secondary | ICD-10-CM

## 2018-03-24 DIAGNOSIS — I10 Essential (primary) hypertension: Secondary | ICD-10-CM

## 2018-03-24 DIAGNOSIS — E440 Moderate protein-calorie malnutrition: Secondary | ICD-10-CM

## 2018-03-24 HISTORY — DX: Moderate protein-calorie malnutrition: E44.0

## 2018-03-24 LAB — ANTI-JO 1 ANTIBODY, IGG: Anti JO-1: <0.2 AI (ref 0.0–0.9)

## 2018-03-24 LAB — ANCA TITERS: C-ANCA: <1:20 {titer}

## 2018-03-24 LAB — MITOCHONDRIAL ANTIBODIES: Mitochondrial M2 Ab, IgG: <20 Units (ref 0.0–20.0)

## 2018-03-24 LAB — CYCLIC CITRUL PEPTIDE ANTIBODY, IGG/IGA: CCP ANTIBODIES IGG/IGA: 2 U (ref 0–19)

## 2018-03-24 LAB — ANTI-SMOOTH MUSCLE ANTIBODY, IGG: F-Actin IgG: 6 Units (ref 0–19)

## 2018-03-24 LAB — ANA W/REFLEX IF POSITIVE: Anti Nuclear Antibody(ANA): NEGATIVE

## 2018-03-24 LAB — RHEUMATOID FACTOR

## 2018-03-24 MED ORDER — METHOCARBAMOL 1000 MG/10ML IJ SOLN
500.0000 mg | Freq: Four times a day (QID) | INTRAMUSCULAR | Status: DC | PRN
  Administered 2018-03-24: 500 mg via INTRAVENOUS
  Filled 2018-03-24: qty 550

## 2018-03-24 MED ORDER — METHOCARBAMOL 500 MG PO TABS: 500.0000 mg | ORAL_TABLET | Freq: Four times a day (QID) | ORAL | 0 refills | Status: DC

## 2018-03-24 MED ORDER — DIPHENHYDRAMINE HCL 50 MG/ML IJ SOLN
25.0000 mg | Freq: Four times a day (QID) | INTRAMUSCULAR | Status: DC | PRN
  Administered 2018-03-24: 25 mg via INTRAVENOUS
  Filled 2018-03-24: qty 1

## 2018-03-24 MED ORDER — HYDROCORTISONE 1 % EX CREA
TOPICAL_CREAM | Freq: Two times a day (BID) | CUTANEOUS | Status: DC
  Administered 2018-03-24: 1 via TOPICAL
  Filled 2018-03-24: qty 28

## 2018-03-24 MED ORDER — HYDROCODONE-ACETAMINOPHEN 5-325 MG PO TABS: 1.0000 | ORAL_TABLET | Freq: Four times a day (QID) | ORAL | 0 refills | Status: AC | PRN

## 2018-03-24 MED ORDER — TAMSULOSIN HCL 0.4 MG PO CAPS: 0.4000 mg | ORAL_CAPSULE | Freq: Every day | ORAL | 0 refills | Status: DC

## 2018-03-24 MED ORDER — ENSURE ENLIVE PO LIQD: 237.0000 mL | Freq: Two times a day (BID) | ORAL | 12 refills | Status: DC

## 2018-03-24 NOTE — Progress Notes (Signed)
Physical Therapy Treatment and Discharge from Acute PT Patient Details Name: Keith Ramos MRN: [id number] DOB: 01-28-1956 Today's Date: 03/24/2018    History of Present Illness 62 yo male admitted with abd pain, N/V, hypokalemia, orthostatic hypotension, weakness.     PT Comments    Pt reports back pain improved, and he requested to go outside if possible.  Checked with RN, and she was agreeable with PT supervising.  Pt mobilizing well and no LOB observed this visit.  Pt reports feeling better and eager for d/c soon.  Pt educated to slowly progress activity.  Since pt is mobilizing at modified independent level, will d/c from acute PT.  Pt encouraged to ambulate during acute stay (only around unit). Pt can f/u with OP PT.   Follow Up Recommendations  Outpatient PT     Equipment Recommendations  None recommended by PT    Recommendations for Other Services       Precautions / Restrictions Precautions Precautions: Fall    Mobility  Bed Mobility Overal bed mobility: Independent                Transfers Overall transfer level: Independent                  Ambulation/Gait Ambulation/Gait assistance: Modified independent (Device/Increase time);Supervision Gait Distance (Feet): 500 Feet Assistive device: None Gait Pattern/deviations: Step-through pattern     General Gait Details: pt pushed his IV pole, no unsteadiness or LOB even with scanning environment, no c/o symptoms   Stairs             Wheelchair Mobility    Modified Rankin (Stroke Patients Only)       Balance                                            Cognition Arousal/Alertness: Awake/alert Behavior During Therapy: WFL for tasks assessed/performed Overall Cognitive Status: Within Functional Limits for tasks assessed                                        Exercises      General Comments        Pertinent Vitals/Pain Pain Assessment: No/denies  pain(reports meds are working for his back pain)    Home Living                      Prior Function            PT Goals (current goals can now be found in the care plan section) Progress towards PT goals: Goals met/education completed, patient discharged from PT    Frequency           PT Plan Other (comment)(d/c from acute PT)    Co-evaluation              AM-PAC PT "6 Clicks" Daily Activity  Outcome Measure  Difficulty turning over in bed (including adjusting bedclothes, sheets and blankets)?: None Difficulty moving from lying on back to sitting on the side of the bed? : None Difficulty sitting down on and standing up from a chair with arms (e.g., wheelchair, bedside commode, etc,.)?: None Help needed moving to and from a bed to chair (including a wheelchair)?: None Help needed walking in hospital room?: None Help  needed climbing 3-5 steps with a railing? : A Little 6 Click Score: 23    End of Session   Activity Tolerance: Patient tolerated treatment well Patient left: in chair;with call bell/phone within reach Nurse Communication: Mobility status PT Visit Diagnosis: Muscle weakness (generalized) (M62.81)     Time: 9794-8016 PT Time Calculation (min) (ACUTE ONLY): 26 min  Charges:  $Gait Training: 8-22 mins                    G Codes:       Carmelia Bake, PT, DPT 03/24/2018 Pager: [phone]  York Ram E 03/24/2018, 4:16 PM

## 2018-03-24 NOTE — Progress Notes (Addendum)
Pt discharge instructions reviewed with pt. Pt ambulated downstairs to discharge area. No questions or concerns at this time. Currently awaiting for uber ride.

## 2018-03-24 NOTE — Progress Notes (Signed)
PROGRESS NOTE    Keith Ramos  JGO:[id number] DOB: 17-Jan-1956 DOA: 03/22/2018 PCP: Albina Billet, MD    Brief Narrative: 62 year old with a history of essential hypertension, diverticulosis, GERD, possible iron deficiency anemia came to the hospital who has been feeling generally weak for the past 2-3 months and has lost about 18 pounds.  But over the course of last 6 days he has had generalized abdominal pain especially in the lower abdomen along with nausea and intermittent nonbloody vomiting.  He has recently seen outpatient gastroenterology for iron deficiency anemia and is scheduled for endoscopy and a colonoscopy on July 15.  In the meantime he is admitted due to acute abdominal complaints.  CT of the abdomen pelvis is negative for any acute pathology but shows nonobstructive renal stone.    Assessment & Plan:   Active Problems:   HYPERTENSION, BENIGN ESSENTIAL, LABILE   Hypokalemia   Nausea and vomiting   Abdominal pain   Nausea & vomiting   Malnutrition of moderate degree   Nausea, vomiting and abdominal pain, with generalized weakness:  Positive orthostatic vital signs.  Probably sec to enteropathogenic e coli gastroenteritis.  Anti emetics, ain control and IV fluids.  Recommend outpatient follow up with gastroenterology.    Back pain:  Migratory, more in the lower back for the last few weeks.  Unclear etiology.  Ck levels wnl.  No trauma.  No radiculopathy.  Started him on robaxin and if no improvement will get MRI of the back.  PT eval recommending outpatient PT.  If no improvement recommend outpatient follow up with rhrumatology.    Weight loss unintentional.  Will need outpatient colonoscopy and cancer screening.    Hypertension;  Well controlled.    ? Tick bite.  Lyme titres pending.    Moderate malnutrition:  Nutrition consulted.     DVT prophylaxis: scd's  Code Status: full code.  Family Communication: none at bedside.  Disposition Plan: Home  when back pain improves.    Consultants:   None.   Procedures: None.  Antimicrobials: None.   Subjective: Lower back pain , severe.   Objective: Vitals:   03/23/18 0451 03/23/18 2105 03/24/18 0508 03/24/18 1236  BP: (!) 99/58 (!) 146/78 114/81 127/67  Pulse: 60 76 61 67  Resp: 16 18 18 20   Temp: 98.7 F (37.1 C) 98.2 F (36.8 C) (!) 97.5 F (36.4 C) 98.1 F (36.7 C)  TempSrc: Oral Oral Oral Oral  SpO2: 98% 99% 98%   Weight:      Height:        Intake/Output Summary (Last 24 hours) at 03/24/2018 1320 Last data filed at 03/24/2018 1319 Gross per 24 hour  Intake 2153.33 ml  Output 400 ml  Net 1753.33 ml   Filed Weights   03/22/18 1928 03/22/18 2122  Weight: 59.4 kg (131 lb) 60.9 kg (134 lb 4.2 oz)    Examination:  General exam: Appears calm and comfortable  Respiratory system: Clear to auscultation. Respiratory effort normal. Cardiovascular system: S1 & S2 heard, RRR. No JVD, murmurs,  No pedal edema. Gastrointestinal system: Abdomen is nondistended, soft and nontender. No organomegaly or masses felt. Normal bowel sounds heard. Central nervous system: Alert and oriented. No focal neurological deficits. Extremities: Symmetric 5 x 5 power. Skin: No rashes, lesions or ulcers Psychiatry: Judgement and insight appear normal. Mood & affect appropriate.     Data Reviewed: I have personally reviewed following labs and imaging studies  CBC: Recent Labs  Lab 03/22/18 1336  03/23/18 0254  WBC 11.8* 7.7  NEUTROABS 9.3*  --   HGB 12.9* 11.2*  HCT 38.2* 33.4*  MCV 83.6 85.6  PLT 493* 761   Basic Metabolic Panel: Recent Labs  Lab 03/22/18 1336 03/22/18 2020 03/23/18 0254  NA 136  --  140  K 3.2*  --  4.1  CL 97*  --  105  CO2 29  --  26  GLUCOSE 109*  --  88  BUN 16  --  16  CREATININE 0.86  --  0.88  CALCIUM 9.2  --  8.9  MG  --  2.3 2.0  PHOS  --   --  3.7   GFR: Estimated Creatinine Clearance: 75.9 mL/min (by C-G formula based on SCr of 0.88  mg/dL). Liver Function Tests: Recent Labs  Lab 03/22/18 1336 03/23/18 0254  AST 18 17  ALT 23 21  ALKPHOS 84 67  BILITOT 0.4 0.5  PROT 7.2 6.2*  ALBUMIN 3.5 2.9*   Recent Labs  Lab 03/22/18 1336  LIPASE 23   No results for input(s): AMMONIA in the last 168 hours. Coagulation Profile: No results for input(s): INR, PROTIME in the last 168 hours. Cardiac Enzymes: Recent Labs  Lab 03/22/18 2020 03/23/18 0254 03/23/18 0934  CKTOTAL 44*  --   --   TROPONINI <0.03 <0.03 <0.03   BNP (last 3 results) No results for input(s): PROBNP in the last 8760 hours. HbA1C: No results for input(s): HGBA1C in the last 72 hours. CBG: No results for input(s): GLUCAP in the last 168 hours. Lipid Profile: No results for input(s): CHOL, HDL, LDLCALC, TRIG, CHOLHDL, LDLDIRECT in the last 72 hours. Thyroid Function Tests: Recent Labs    03/23/18 0254  TSH 0.922   Anemia Panel: No results for input(s): VITAMINB12, FOLATE, FERRITIN, TIBC, IRON, RETICCTPCT in the last 72 hours. Sepsis Labs: Recent Labs  Lab 03/22/18 2014  LATICACIDVEN 0.89    Recent Results (from the past 240 hour(s))  Gastrointestinal Panel by PCR , Stool     Status: Abnormal   Collection Time: 03/23/18  9:47 AM  Result Value Ref Range Status   Campylobacter species NOT DETECTED NOT DETECTED Final   Plesimonas shigelloides NOT DETECTED NOT DETECTED Final   Salmonella species NOT DETECTED NOT DETECTED Final   Yersinia enterocolitica NOT DETECTED NOT DETECTED Final   Vibrio species NOT DETECTED NOT DETECTED Final   Vibrio cholerae NOT DETECTED NOT DETECTED Final   Enteroaggregative E coli (EAEC) NOT DETECTED NOT DETECTED Final   Enteropathogenic E coli (EPEC) DETECTED (A) NOT DETECTED Final    Comment: RESULT CALLED TO, READ BACK BY AND VERIFIED WITH: ABBY PINNIX @1428  03/23/18 AKT    Enterotoxigenic E coli (ETEC) NOT DETECTED NOT DETECTED Final   Shiga like toxin producing E coli (STEC) NOT DETECTED NOT DETECTED  Final   Shigella/Enteroinvasive E coli (EIEC) NOT DETECTED NOT DETECTED Final   Cryptosporidium NOT DETECTED NOT DETECTED Final   Cyclospora cayetanensis NOT DETECTED NOT DETECTED Final   Entamoeba histolytica NOT DETECTED NOT DETECTED Final   Giardia lamblia NOT DETECTED NOT DETECTED Final   Adenovirus F40/41 NOT DETECTED NOT DETECTED Final   Astrovirus NOT DETECTED NOT DETECTED Final   Norovirus GI/GII NOT DETECTED NOT DETECTED Final   Rotavirus A NOT DETECTED NOT DETECTED Final   Sapovirus (I, II, IV, and V) NOT DETECTED NOT DETECTED Final    Comment: Performed at [facility], [address] [place], [place] [address]  Radiology Studies: Dg Chest 2 View  Result Date: 03/22/2018 CLINICAL DATA:  Abdominal pain with nausea and vomiting EXAM: CHEST - 2 VIEW COMPARISON:  None. FINDINGS: The heart size and mediastinal contours are within normal limits. Both lungs are clear. Mild wedging of approximate T12. IMPRESSION: No active cardiopulmonary disease. Electronically Signed   By: Donavan Foil M.D.   On: 03/22/2018 21:35   Ct Abdomen Pelvis W Contrast  Result Date: 03/22/2018 CLINICAL DATA:  Acute generalized abdominal pain. EXAM: CT ABDOMEN AND PELVIS WITH CONTRAST TECHNIQUE: Multidetector CT imaging of the abdomen and pelvis was performed using the standard protocol following bolus administration of intravenous contrast. CONTRAST:  13mL ISOVUE-300 IOPAMIDOL (ISOVUE-300) INJECTION 61% COMPARISON:  CT scan of August 19, 2015. FINDINGS: Lower chest: No acute abnormality. Hepatobiliary: No focal liver abnormality is seen. No gallstones, gallbladder wall thickening, or biliary dilatation. Pancreas: Unremarkable. No pancreatic ductal dilatation or surrounding inflammatory changes. Spleen: Normal in size without focal abnormality. Adrenals/Urinary Tract: Adrenal glands appear normal. Small right renal calculus is noted. No hydronephrosis or renal obstruction is noted. Urinary  bladder is unremarkable. Stomach/Bowel: The stomach appears normal. There is no evidence of bowel obstruction or inflammation. Diverticulosis of descending and sigmoid colon is noted without inflammation. Vascular/Lymphatic: Aortic atherosclerosis. No enlarged abdominal or pelvic lymph nodes. Reproductive: Prostate is unremarkable. Other: No abdominal wall hernia or abnormality. No abdominopelvic ascites. Musculoskeletal: No acute or significant osseous findings. IMPRESSION: Diverticulosis of descending and sigmoid colon without inflammation. Nonobstructive right nephrolithiasis. No acute abnormality is noted in the abdomen or pelvis. Aortic Atherosclerosis (ICD10-I70.0). Electronically Signed   By: Marijo Conception, M.D.   On: 03/22/2018 15:32        Scheduled Meds: . enoxaparin (LOVENOX) injection  40 mg Subcutaneous Q24H  . feeding supplement (ENSURE ENLIVE)  237 mL Oral BID BM  . mouth rinse  15 mL Mouth Rinse BID  . pantoprazole  40 mg Oral Daily  . tamsulosin  0.4 mg Oral Daily   Continuous Infusions: . methocarbamol (ROBAXIN)  IV 500 mg (03/24/18 1315)     LOS: 1 day    Time spent: 35 minutes.     Hosie Poisson, MD [facility] Pager [phone]  If 7PM-7AM, please contact night-coverage [email or web address] Password [email or web address] 03/24/2018, 1:20 PM

## 2018-03-24 NOTE — Evaluation (Signed)
Occupational Therapy Evaluation Patient Details Name: KEY CEN MRN: [id number] DOB: 17-Jul-1956 Today's Date: 03/24/2018    History of Present Illness 62 yo male admitted with abd pain, N/V, hypokalemia, orthostatic hypotension, weakness.    Clinical Impression   Pt was admitted for the above.  At baseline, he is very active: runs marathons and bikes. 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  He currently needs min guard for balance when walking to perform ADLs. Will follow in acute setting with independent level goals.    Follow Up Recommendations  No OT follow up    Equipment Recommendations  None recommended by OT    Recommendations for Other Services       Precautions / Restrictions Precautions Precautions: Fall Restrictions Weight Bearing Restrictions: No      Mobility Bed Mobility Overal bed mobility: Independent                Transfers Overall transfer level: Independent                    Balance             Standing balance-Leahy Scale: Fair Standing balance comment: LOB when ambulating to bathroom, which he self corrected                           ADL either performed or assessed with clinical judgement   ADL Overall ADL's : Needs assistance/impaired                                       General ADL Comments: min guard for safety when ambulating to bathroom/gather clothes  Pt has been in bed.  Had a LOB, but able to self correct.  Simulated stepping over tub.  Pt doesn't want long sponge to allieviate bending over.       Vision         Perception     Praxis      Pertinent Vitals/Pain Pain Assessment: Faces Faces Pain Scale: Hurts even more Pain Location: back Pain Descriptors / Indicators: Aching;Discomfort Pain Intervention(s): Limited activity within patient's tolerance;Monitored during session;Repositioned;Patient requesting pain meds-RN notified     Hand Dominance     Extremity/Trunk  Assessment Upper Extremity Assessment Upper Extremity Assessment: Overall WFL for tasks assessed(L detached biceps head per pt)           Communication Communication Communication: No difficulties   Cognition Arousal/Alertness: Awake/alert Behavior During Therapy: WFL for tasks assessed/performed Overall Cognitive Status: Within Functional Limits for tasks assessed                                     General Comments       Exercises     Shoulder Instructions      Home Living Family/patient expects to be discharged to:: Private residence Living Arrangements: Alone Available Help at Discharge: Family;Available PRN/intermittently Type of Home: House             Bathroom Shower/Tub: Teacher, early years/pre: Standard     Home Equipment: None          Prior Functioning/Environment Level of Independence: Independent                 OT Problem List: Pain;Impaired  balance (sitting and/or standing)      OT Treatment/Interventions: Self-care/ADL training;Balance training;Patient/family education    OT Goals(Current goals can be found in the care plan section) Acute Rehab OT Goals Patient Stated Goal: to get back to training. (very active lifestyle) OT Goal Formulation: With patient Time For Goal Achievement: 04/07/18 Potential to Achieve Goals: Good ADL Goals Additional ADL Goal #1: pt will be independent performing bathroom transfers and gathering clothes  OT Frequency: Min 2X/week   Barriers to D/C:            Co-evaluation              AM-PAC PT "6 Clicks" Daily Activity     Outcome Measure Help from another person eating meals?: None Help from another person taking care of personal grooming?: A Little Help from another person toileting, which includes using toliet, bedpan, or urinal?: A Little Help from another person bathing (including washing, rinsing, drying)?: A Little Help from another person to put on and  taking off regular upper body clothing?: A Little Help from another person to put on and taking off regular lower body clothing?: A Little 6 Click Score: 19   End of Session Nurse Communication: Patient requests pain meds  Activity Tolerance: Patient tolerated treatment well Patient left: in chair;with call bell/phone within reach  OT Visit Diagnosis: Unsteadiness on feet (R26.81)                Time: 5364-6803 OT Time Calculation (min): 20 min Charges:  OT General Charges $OT Visit: 1 Visit OT Evaluation $OT Eval Low Complexity: 1 Low G-Codes:     [place], OTR/L [phone] 03/24/2018  Anayia Eugene 03/24/2018, 8:31 AM

## 2018-03-25 NOTE — Discharge Summary (Signed)
Physician Discharge Summary  Keith Ramos XNA:[id number] DOB: 02-25-1956 DOA: 03/22/2018  PCP: Albina Billet, MD  Admit date: 03/22/2018 Discharge date: 03/24/2018  Admitted From: Home.  Disposition:  Home.   Recommendations for Outpatient Follow-up:  1. Follow up with PCP in 1-2 weeks 2. Please obtain BMP/CBC in one week Please follow up the auto immune work up with PCP  Please call Rheumatology and make appt to follow .   Discharge Condition:stable. CODE STATUS: full code.  Diet recommendation: Heart Healthy  Brief/Interim Summary: 62 year old with a history of essential hypertension, diverticulosis, GERD, possible iron deficiency anemia came to the hospital who has been feeling generally weak for the past 2-3 months and has lost about 18 pounds. But over the course of last 6 days he has had generalized abdominal pain especially in the lower abdomen along with nausea and intermittent nonbloody vomiting. He has recently seen outpatient gastroenterology for iron deficiency anemia and is scheduled for endoscopy and a colonoscopy on July 15. In the meantime he is admitted due to acute abdominal complaints. CT of the abdomen pelvis is negative for any acute pathology but shows nonobstructive renal stone.    Discharge Diagnoses:  Active Problems:   HYPERTENSION, BENIGN ESSENTIAL, LABILE   Hypokalemia   Nausea and vomiting   Abdominal pain   Nausea & vomiting   Malnutrition of moderate degree   Nausea, vomiting and abdominal pain, with generalized weakness:  Positive orthostatic vital signs.  Probably sec to enteropathogenic e coli gastroenteritis.  Anti emetics, and pain control.  Recommend outpatient follow up with gastroenterology.    Repeat orthostatic vital signs wnl.    Back pain:  Migratory, more in the lower back for the last few weeks.  Unclear etiology.  Ck levels wnl.  No trauma.  No radiculopathy.  Started him on robaxin and pt reports combination of pain  medications and muscle relaxant resolved the back pain.  PT eval recommending outpatient PT.  If no improvement recommend outpatient follow up with rhrumatology.    Weight loss unintentional.  Will need outpatient colonoscopy and cancer screening. Discussed extensively with the patient.  Follow up with GI as scheduled.    Hypertension;  Well controlled.    ? Tick bite.  Lyme titres pending. Recommend outpatient follow up of the lyme titire results.    Moderate malnutrition:  Nutrition consulted.    Discharge Instructions  Discharge Instructions    Diet - low sodium heart healthy   Complete by:  As directed    Discharge instructions   Complete by:  As directed    Please follow up with PCP on Monday.  Please follow up with rheumatology in one week .  Please follow up with gastroenterology as scheduled next week. Please follow up the results of the autoimmune work up with PCP.     Allergies as of 03/24/2018   No Known Allergies     Medication List    TAKE these medications   amLODipine 5 MG tablet Commonly known as:  NORVASC Take 5 mg by mouth daily.   DRAMAMINE PO Take 1 tablet by mouth daily as needed (nausea).   feeding supplement (ENSURE ENLIVE) Liqd Take 237 mLs by mouth 2 (two) times daily between meals.   HYDROcodone-acetaminophen 5-325 MG tablet Commonly known as:  NORCO/VICODIN Take 1 tablet by mouth every 6 (six) hours as needed for up to 2 days for moderate pain (if tramadol not effective).   LIDOCAINE EX Apply 1 application topically daily  as needed (pain).   lisinopril 20 MG tablet Commonly known as:  PRINIVIL,ZESTRIL Take 20 mg by mouth 2 (two) times daily.   methocarbamol 500 MG tablet Commonly known as:  ROBAXIN Take 1 tablet (500 mg total) by mouth 4 (four) times daily.   Na Sulfate-K Sulfate-Mg Sulf 17.5-3.13-1.6 GM/177ML Soln Suprep-Use as directed   omeprazole 20 MG capsule Commonly known as:  PRILOSEC Take 20 mg by mouth  daily.   tamsulosin 0.4 MG Caps capsule Commonly known as:  FLOMAX Take 1 capsule (0.4 mg total) by mouth daily.   traMADol 50 MG tablet Commonly known as:  ULTRAM Take 50-100 mg by mouth every 8 (eight) hours as needed for moderate pain.      Follow-up Information    Albina Billet, MD. Schedule an appointment as soon as possible for a visit in 1 week(s).   Specialty:  Internal Medicine Why:  needs referral to Rheumatology on discharge.  Contact information: [place] [address]   [place] [address] [phone]        Rheumatology, [place]. Schedule an appointment as soon as possible for a visit in 1 week(s).   Contact information: [place] [address] [address] [phone]          No Known Allergies  Consultations: None.   Procedures/Studies: Dg Chest 2 View  Result Date: 03/22/2018 CLINICAL DATA:  Abdominal pain with nausea and vomiting EXAM: CHEST - 2 VIEW COMPARISON:  None. FINDINGS: The heart size and mediastinal contours are within normal limits. Both lungs are clear. Mild wedging of approximate T12. IMPRESSION: No active cardiopulmonary disease. Electronically Signed   By: Donavan Foil M.D.   On: 03/22/2018 21:35   Ct Abdomen Pelvis W Contrast  Result Date: 03/22/2018 CLINICAL DATA:  Acute generalized abdominal pain. EXAM: CT ABDOMEN AND PELVIS WITH CONTRAST TECHNIQUE: Multidetector CT imaging of the abdomen and pelvis was performed using the standard protocol following bolus administration of intravenous contrast. CONTRAST:  161mL ISOVUE-300 IOPAMIDOL (ISOVUE-300) INJECTION 61% COMPARISON:  CT scan of August 19, 2015. FINDINGS: Lower chest: No acute abnormality. Hepatobiliary: No focal liver abnormality is seen. No gallstones, gallbladder wall thickening, or biliary dilatation. Pancreas: Unremarkable. No pancreatic ductal dilatation or surrounding inflammatory changes. Spleen: Normal in size without focal abnormality. Adrenals/Urinary  Tract: Adrenal glands appear normal. Small right renal calculus is noted. No hydronephrosis or renal obstruction is noted. Urinary bladder is unremarkable. Stomach/Bowel: The stomach appears normal. There is no evidence of bowel obstruction or inflammation. Diverticulosis of descending and sigmoid colon is noted without inflammation. Vascular/Lymphatic: Aortic atherosclerosis. No enlarged abdominal or pelvic lymph nodes. Reproductive: Prostate is unremarkable. Other: No abdominal wall hernia or abnormality. No abdominopelvic ascites. Musculoskeletal: No acute or significant osseous findings. IMPRESSION: Diverticulosis of descending and sigmoid colon without inflammation. Nonobstructive right nephrolithiasis. No acute abnormality is noted in the abdomen or pelvis. Aortic Atherosclerosis (ICD10-I70.0). Electronically Signed   By: Marijo Conception, M.D.   On: 03/22/2018 15:32       Subjective: No back pain or any new complaints.   Discharge Exam: Vitals:   03/24/18 0508 03/24/18 1236  BP: 114/81 127/67  Pulse: 61 67  Resp: 18 20  Temp: (!) 97.5 F (36.4 C) 98.1 F (36.7 C)  SpO2: 98%    Vitals:   03/23/18 0451 03/23/18 2105 03/24/18 0508 03/24/18 1236  BP: (!) 99/58 (!) 146/78 114/81 127/67  Pulse: 60 76 61 67  Resp: 16 18 18  20  Temp: 98.7 F (37.1 C) 98.2 F (36.8 C) (!) 97.5 F (36.4 C) 98.1 F (36.7 C)  TempSrc: Oral Oral Oral Oral  SpO2: 98% 99% 98%   Weight:      Height:        General: Pt is alert, awake, not in acute distress Cardiovascular: RRR, S1/S2 +, no rubs, no gallops Respiratory: CTA bilaterally, no wheezing, no rhonchi Abdominal: Soft, NT, ND, bowel sounds + Extremities: no edema, no cyanosis    The results of significant diagnostics from this hospitalization (including imaging, microbiology, ancillary and laboratory) are listed below for reference.     Microbiology: Recent Results (from the past 240 hour(s))  Gastrointestinal Panel by PCR , Stool      Status: Abnormal   Collection Time: 03/23/18  9:47 AM  Result Value Ref Range Status   Campylobacter species NOT DETECTED NOT DETECTED Final   Plesimonas shigelloides NOT DETECTED NOT DETECTED Final   Salmonella species NOT DETECTED NOT DETECTED Final   Yersinia enterocolitica NOT DETECTED NOT DETECTED Final   Vibrio species NOT DETECTED NOT DETECTED Final   Vibrio cholerae NOT DETECTED NOT DETECTED Final   Enteroaggregative E coli (EAEC) NOT DETECTED NOT DETECTED Final   Enteropathogenic E coli (EPEC) DETECTED (A) NOT DETECTED Final    Comment: RESULT CALLED TO, READ BACK BY AND VERIFIED WITH: ABBY PINNIX @1428  03/23/18 AKT    Enterotoxigenic E coli (ETEC) NOT DETECTED NOT DETECTED Final   Shiga like toxin producing E coli (STEC) NOT DETECTED NOT DETECTED Final   Shigella/Enteroinvasive E coli (EIEC) NOT DETECTED NOT DETECTED Final   Cryptosporidium NOT DETECTED NOT DETECTED Final   Cyclospora cayetanensis NOT DETECTED NOT DETECTED Final   Entamoeba histolytica NOT DETECTED NOT DETECTED Final   Giardia lamblia NOT DETECTED NOT DETECTED Final   Adenovirus F40/41 NOT DETECTED NOT DETECTED Final   Astrovirus NOT DETECTED NOT DETECTED Final   Norovirus GI/GII NOT DETECTED NOT DETECTED Final   Rotavirus A NOT DETECTED NOT DETECTED Final   Sapovirus (I, II, IV, and V) NOT DETECTED NOT DETECTED Final    Comment: Performed at [facility], [address] [place], [place] [address]     Labs: BNP (last 3 results) No results for input(s): BNP in the last 8760 hours. Basic Metabolic Panel: Recent Labs  Lab 03/22/18 1336 03/22/18 2020 03/23/18 0254  NA 136  --  140  K 3.2*  --  4.1  CL 97*  --  105  CO2 29  --  26  GLUCOSE 109*  --  88  BUN 16  --  16  CREATININE 0.86  --  0.88  CALCIUM 9.2  --  8.9  MG  --  2.3 2.0  PHOS  --   --  3.7   Liver Function Tests: Recent Labs  Lab 03/22/18 1336 03/23/18 0254  AST 18 17  ALT 23 21  ALKPHOS 84 67  BILITOT 0.4 0.5  PROT  7.2 6.2*  ALBUMIN 3.5 2.9*   Recent Labs  Lab 03/22/18 1336  LIPASE 23   No results for input(s): AMMONIA in the last 168 hours. CBC: Recent Labs  Lab 03/22/18 1336 03/23/18 0254  WBC 11.8* 7.7  NEUTROABS 9.3*  --   HGB 12.9* 11.2*  HCT 38.2* 33.4*  MCV 83.6 85.6  PLT 493* 371   Cardiac Enzymes: Recent Labs  Lab 03/22/18 2020 03/23/18 0254 03/23/18 0934  CKTOTAL 44*  --   --   TROPONINI <0.03 <0.03 <  0.03   BNP: Invalid input(s): POCBNP CBG: No results for input(s): GLUCAP in the last 168 hours. D-Dimer No results for input(s): DDIMER in the last 72 hours. Hgb A1c No results for input(s): HGBA1C in the last 72 hours. Lipid Profile No results for input(s): CHOL, HDL, LDLCALC, TRIG, CHOLHDL, LDLDIRECT in the last 72 hours. Thyroid function studies Recent Labs    03/23/18 0254  TSH 0.922   Anemia work up No results for input(s): VITAMINB12, FOLATE, FERRITIN, TIBC, IRON, RETICCTPCT in the last 72 hours. Urinalysis    Component Value Date/Time   COLORURINE YELLOW 03/22/2018 1432   APPEARANCEUR CLOUDY (A) 03/22/2018 1432   LABSPEC 1.013 03/22/2018 1432   PHURINE 8.0 03/22/2018 1432   GLUCOSEU NEGATIVE 03/22/2018 1432   HGBUR NEGATIVE 03/22/2018 1432   BILIRUBINUR NEGATIVE 03/22/2018 1432   KETONESUR NEGATIVE 03/22/2018 1432   PROTEINUR NEGATIVE 03/22/2018 1432   NITRITE NEGATIVE 03/22/2018 1432   LEUKOCYTESUR NEGATIVE 03/22/2018 1432   Sepsis Labs Invalid input(s): PROCALCITONIN,  WBC,  LACTICIDVEN Microbiology Recent Results (from the past 240 hour(s))  Gastrointestinal Panel by PCR , Stool     Status: Abnormal   Collection Time: 03/23/18  9:47 AM  Result Value Ref Range Status   Campylobacter species NOT DETECTED NOT DETECTED Final   Plesimonas shigelloides NOT DETECTED NOT DETECTED Final   Salmonella species NOT DETECTED NOT DETECTED Final   Yersinia enterocolitica NOT DETECTED NOT DETECTED Final   Vibrio species NOT DETECTED NOT DETECTED Final    Vibrio cholerae NOT DETECTED NOT DETECTED Final   Enteroaggregative E coli (EAEC) NOT DETECTED NOT DETECTED Final   Enteropathogenic E coli (EPEC) DETECTED (A) NOT DETECTED Final    Comment: RESULT CALLED TO, READ BACK BY AND VERIFIED WITH: ABBY PINNIX @1428  03/23/18 AKT    Enterotoxigenic E coli (ETEC) NOT DETECTED NOT DETECTED Final   Shiga like toxin producing E coli (STEC) NOT DETECTED NOT DETECTED Final   Shigella/Enteroinvasive E coli (EIEC) NOT DETECTED NOT DETECTED Final   Cryptosporidium NOT DETECTED NOT DETECTED Final   Cyclospora cayetanensis NOT DETECTED NOT DETECTED Final   Entamoeba histolytica NOT DETECTED NOT DETECTED Final   Giardia lamblia NOT DETECTED NOT DETECTED Final   Adenovirus F40/41 NOT DETECTED NOT DETECTED Final   Astrovirus NOT DETECTED NOT DETECTED Final   Norovirus GI/GII NOT DETECTED NOT DETECTED Final   Rotavirus A NOT DETECTED NOT DETECTED Final   Sapovirus (I, II, IV, and V) NOT DETECTED NOT DETECTED Final    Comment: Performed at [facility], [address] [place], [place] [address]     Time coordinating discharge:35 minutes  SIGNED:   Hosie Poisson, MD  [facility] 03/25/2018, 7:49 AM Pager   If 7PM-7AM, please contact night-coverage [email or web address] Password [email or web address]

## 2018-03-30 LAB — B. BURGDORFI ANTIBODIES

## 2018-04-05 ENCOUNTER — Ambulatory Visit (AMBULATORY_SURGERY_CENTER): Payer: Self-pay | Admitting: Internal Medicine

## 2018-04-05 ENCOUNTER — Encounter: Payer: Self-pay | Admitting: Internal Medicine

## 2018-04-05 VITALS — BP 133/79 | HR 75 | Temp 99.8°F | Resp 11 | Ht 67.0 in | Wt 145.0 lb

## 2018-04-05 DIAGNOSIS — K222 Esophageal obstruction: Secondary | ICD-10-CM

## 2018-04-05 DIAGNOSIS — R634 Abnormal weight loss: Secondary | ICD-10-CM

## 2018-04-05 DIAGNOSIS — D123 Benign neoplasm of transverse colon: Secondary | ICD-10-CM

## 2018-04-05 DIAGNOSIS — K635 Polyp of colon: Secondary | ICD-10-CM

## 2018-04-05 DIAGNOSIS — D649 Anemia, unspecified: Secondary | ICD-10-CM

## 2018-04-05 DIAGNOSIS — K219 Gastro-esophageal reflux disease without esophagitis: Secondary | ICD-10-CM

## 2018-04-05 DIAGNOSIS — Z86010 Personal history of colonic polyps: Secondary | ICD-10-CM

## 2018-04-05 DIAGNOSIS — D122 Benign neoplasm of ascending colon: Secondary | ICD-10-CM

## 2018-04-05 MED ORDER — SODIUM CHLORIDE 0.9 % IV SOLN: 500.0000 mL | Freq: Once | INTRAVENOUS | Status: DC

## 2018-04-05 NOTE — Op Note (Signed)
[facility] Patient Name: Keith Ramos Procedure Date: 04/05/2018 1:32 PM MRN: [id number] Endoscopist: Docia Chuck. Henrene Pastor , MD Age: 62 Referring MD:  Date of Birth: 13-Jun-1956 Gender: Male Account #: [id number] Procedure:                Upper GI endoscopy Indications:              Esophageal reflux, Weight loss, normocytic anemia Medicines:                Monitored Anesthesia Care Procedure:                Pre-Anesthesia Assessment:                           - Prior to the procedure, a History and Physical                            was performed, and patient medications and                            allergies were reviewed. The patient's tolerance of                            previous anesthesia was also reviewed. The risks                            and benefits of the procedure and the sedation                            options and risks were discussed with the patient.                            All questions were answered, and informed consent                            was obtained. Prior Anticoagulants: The patient has                            taken no previous anticoagulant or antiplatelet                            agents. ASA Grade Assessment: II - A patient with                            mild systemic disease. After reviewing the risks                            and benefits, the patient was deemed in                            satisfactory condition to undergo the procedure.                           After obtaining informed consent, the endoscope was  passed under direct vision. Throughout the                            procedure, the patient's blood pressure, pulse, and                            oxygen saturations were monitored continuously. The                            Model GIF-[id number]) scope was introduced                            through the mouth, and advanced to the second part                            of  duodenum. The upper GI endoscopy was                            accomplished without difficulty. The patient                            tolerated the procedure well. Scope In: Scope Out: Findings:                 The esophagus was normal safe large caliber distal                            esophageal ring.                           The stomach was normal.                           The examined duodenum was normal.                           The cardia and gastric fundus were normal on                            retroflexion. Complications:            No immediate complications. Estimated Blood Loss:     Estimated blood loss: none. Impression:               1. Incidental esophageal ring                           2. Otherwise normal EGD                           3. No GI cause for weight loss, anemia, and                            myalgias identified Recommendation:           - Patient has a contact number available for  emergencies. The signs and symptoms of potential                            delayed complications were discussed with the                            patient. Return to normal activities tomorrow.                            Written discharge instructions were provided to the                            patient.                           - Resume previous diet.                           - Continue present medications.                           - Return to the care of your primary care per Dr.                            Hall Busing for further evaluation of your symptom complex Docia Chuck. Henrene Pastor, MD 04/05/2018 2:17:33 PM This report has been signed electronically.

## 2018-04-05 NOTE — Progress Notes (Signed)
A and O x3. Report to RN. Tolerated MAC anesthesia well.

## 2018-04-05 NOTE — Op Note (Signed)
[facility] Patient Name: Keith Ramos Procedure Date: 04/05/2018 1:33 PM MRN: [id number] Endoscopist: Docia Chuck. Henrene Pastor , MD Age: 62 Referring MD:  Date of Birth: 18-Nov-1955 Gender: Male Account #: [id number] Procedure:                Colonoscopy, with cold snare polypectomy x 7 Indications:              High risk colon cancer surveillance: Personal                            history of non-advanced adenomas. Previous                            examinations 2001 and 2005. Overdue for follow-up.                            Other issues during recent evaluation included                            normocytic anemia and weight loss Medicines:                Monitored Anesthesia Care Procedure:                Pre-Anesthesia Assessment:                           - Prior to the procedure, a History and Physical                            was performed, and patient medications and                            allergies were reviewed. The patient's tolerance of                            previous anesthesia was also reviewed. The risks                            and benefits of the procedure and the sedation                            options and risks were discussed with the patient.                            All questions were answered, and informed consent                            was obtained. Prior Anticoagulants: The patient has                            taken no previous anticoagulant or antiplatelet                            agents. ASA Grade Assessment: II - A patient with  mild systemic disease. After reviewing the risks                            and benefits, the patient was deemed in                            satisfactory condition to undergo the procedure.                           After obtaining informed consent, the colonoscope                            was passed under direct vision. Throughout the                            procedure, the  patient's blood pressure, pulse, and                            oxygen saturations were monitored continuously. The                            Colonoscope was introduced through the anus and                            advanced to the the cecum, identified by                            appendiceal orifice and ileocecal valve. The                            ileocecal valve, appendiceal orifice, and rectum                            were photographed. The quality of the bowel                            preparation was excellent. The colonoscopy was                            performed without difficulty. The patient tolerated                            the procedure well. The bowel preparation used was                            SUPREP. Scope In: 1:42:45 PM Scope Out: 2:02:57 PM Scope Withdrawal Time: 0 hours 13 minutes 48 seconds  Total Procedure Duration: 0 hours 20 minutes 12 seconds  Findings:                 Seven polyps were found in the transverse colon and                            ascending colon. The polyps were 1 to 5 mm in size.  These polyps were removed with a cold snare.                            Resection and retrieval were complete.                           Multiple small and large-mouthed diverticula were                            found in the entire colon.                           The exam was otherwise without abnormality on                            direct and retroflexion views. Complications:            No immediate complications. Estimated blood loss:                            None. Estimated Blood Loss:     Estimated blood loss: none. Impression:               - Seven 1 to 5 mm polyps in the transverse colon                            and in the ascending colon, removed with a cold                            snare. Resected and retrieved.                           - Diverticulosis in the entire examined colon.                            - The examination was otherwise normal on direct                            and retroflexion views. Recommendation:           - Repeat colonoscopy in 3 - 5 years for                            surveillance.                           - Patient has a contact number available for                            emergencies. The signs and symptoms of potential                            delayed complications were discussed with the                            patient. Return to normal activities tomorrow.  Written discharge instructions were provided to the                            patient.                           - Resume previous diet.                           - Continue present medications.                           - Await pathology results.                           - Return to the care of Dr. Wynema Birch. Henrene Pastor, MD 04/05/2018 2:14:33 PM This report has been signed electronically.

## 2018-04-05 NOTE — Patient Instructions (Addendum)
Handouts given:  Polyps, Diverticulosis, and High fiber diet.  YOU HAD AN ENDOSCOPIC PROCEDURE TODAY AT [facility]:   Refer to the procedure report that was given to you for any specific questions about what was found during the examination.  If the procedure report does not answer your questions, please call your gastroenterologist to clarify.  If you requested that your care partner not be given the details of your procedure findings, then the procedure report has been included in a sealed envelope for you to review at your convenience later.  YOU SHOULD EXPECT: Some feelings of bloating in the abdomen. Passage of more gas than usual.  Walking can help get rid of the air that was put into your GI tract during the procedure and reduce the bloating. If you had a lower endoscopy (such as a colonoscopy or flexible sigmoidoscopy) you may notice spotting of blood in your stool or on the toilet paper. If you underwent a bowel prep for your procedure, you may not have a normal bowel movement for a few days.  Please Note:  You might notice some irritation and congestion in your nose or some drainage.  This is from the oxygen used during your procedure.  There is no need for concern and it should clear up in a day or so.  SYMPTOMS TO REPORT IMMEDIATELY:   Following lower endoscopy (colonoscopy or flexible sigmoidoscopy):  Excessive amounts of blood in the stool  Significant tenderness or worsening of abdominal pains  Swelling of the abdomen that is new, acute  Fever of 100F or higher    Following upper endoscopy (EGD)  Vomiting of blood or coffee ground material  New chest pain or pain under the shoulder blades  Painful or persistently difficult swallowing  New shortness of breath  Fever of 100F or higher  Black, tarry-looking stools  For urgent or emergent issues, a gastroenterologist can be reached at any hour by calling [phone].   DIET:  We do recommend a small  meal at first, but then you may proceed to your regular diet.  Drink plenty of fluids but you should avoid alcoholic beverages for 24 hours.  ACTIVITY:  You should plan to take it easy for the rest of today and you should NOT DRIVE or use heavy machinery until tomorrow (because of the sedation medicines used during the test).    FOLLOW UP: Our staff will call the number listed on your records the next business day following your procedure to check on you and address any questions or concerns that you may have regarding the information given to you following your procedure. If we do not reach you, we will leave a message.  However, if you are feeling well and you are not experiencing any problems, there is no need to return our call.  We will assume that you have returned to your regular daily activities without incident.  If any biopsies were taken you will be contacted by phone or by letter within the next 1-3 weeks.  Please call us at [phone] if you have not heard about the biopsies in 3 weeks.    SIGNATURES/CONFIDENTIALITY: You and/or your care partner have signed paperwork which will be entered into your electronic medical record.  These signatures attest to the fact that that the information above on your After Visit Summary has been reviewed and is understood.  Full responsibility of the confidentiality of this discharge information lies with you and/or your care-partner.

## 2018-04-05 NOTE — Progress Notes (Signed)
Pt's states no medical or surgical changes since previsit or office visit. 

## 2018-04-05 NOTE — Progress Notes (Signed)
Called to room to assist during endoscopic procedure.  Patient ID and intended procedure confirmed with present staff. Received instructions for my participation in the procedure from the performing physician.  

## 2018-04-06 ENCOUNTER — Telehealth: Payer: Self-pay | Admitting: *Deleted

## 2018-04-06 NOTE — Telephone Encounter (Signed)
  Follow up Call-  Call back number 04/05/2018  Post procedure Call Back phone  # [phone]  Permission to leave phone message No  Some recent data might be hidden     Patient questions:  Do you have a fever, pain , or abdominal swelling? No. Pain Score  0 *  Have you tolerated food without any problems? Yes.    Have you been able to return to your normal activities? Yes.    Do you have any questions about your discharge instructions: Diet   No. Medications  No. Follow up visit  No.  Do you have questions or concerns about your Care? No.  Actions: * If pain score is 4 or above: No action needed, pain <4.

## 2018-04-12 ENCOUNTER — Encounter: Payer: Self-pay | Admitting: Internal Medicine

## 2018-09-05 IMAGING — CR DG CHEST 2V
3 series · 3 of 3 positions shown · non-contrast
Comparison: None.

CLINICAL DATA: Abdominal pain with nausea and vomiting

EXAM:
CHEST - 2 VIEW

[w chest lat (1 of 2)]
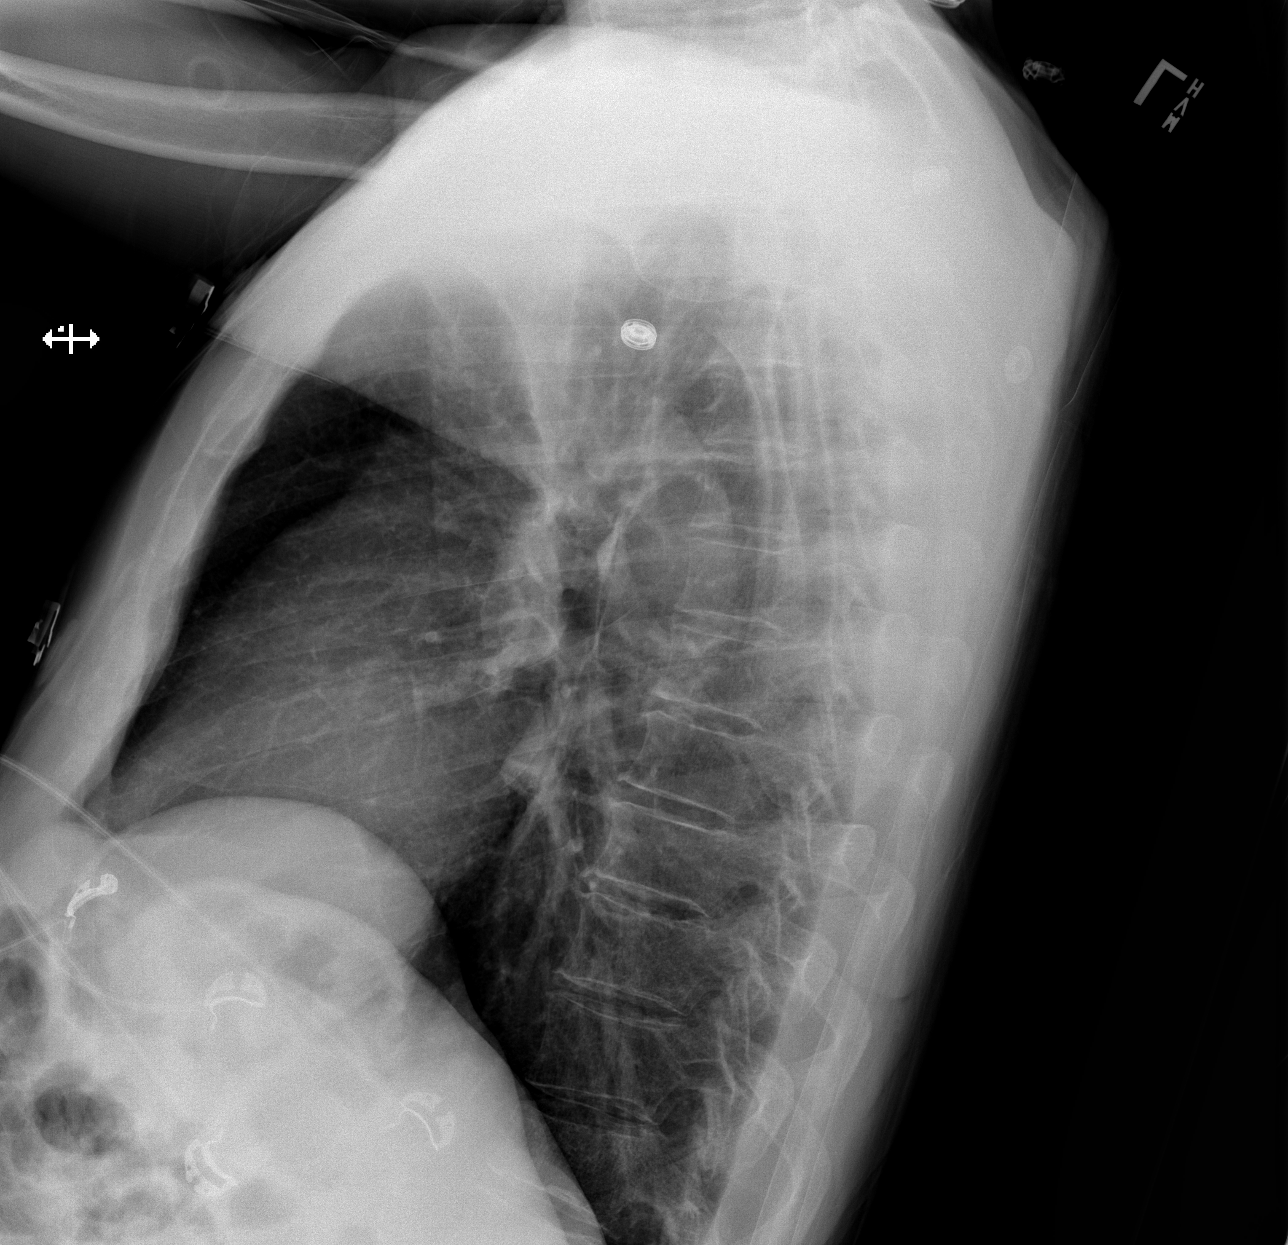

[w chest lat (2 of 2)]
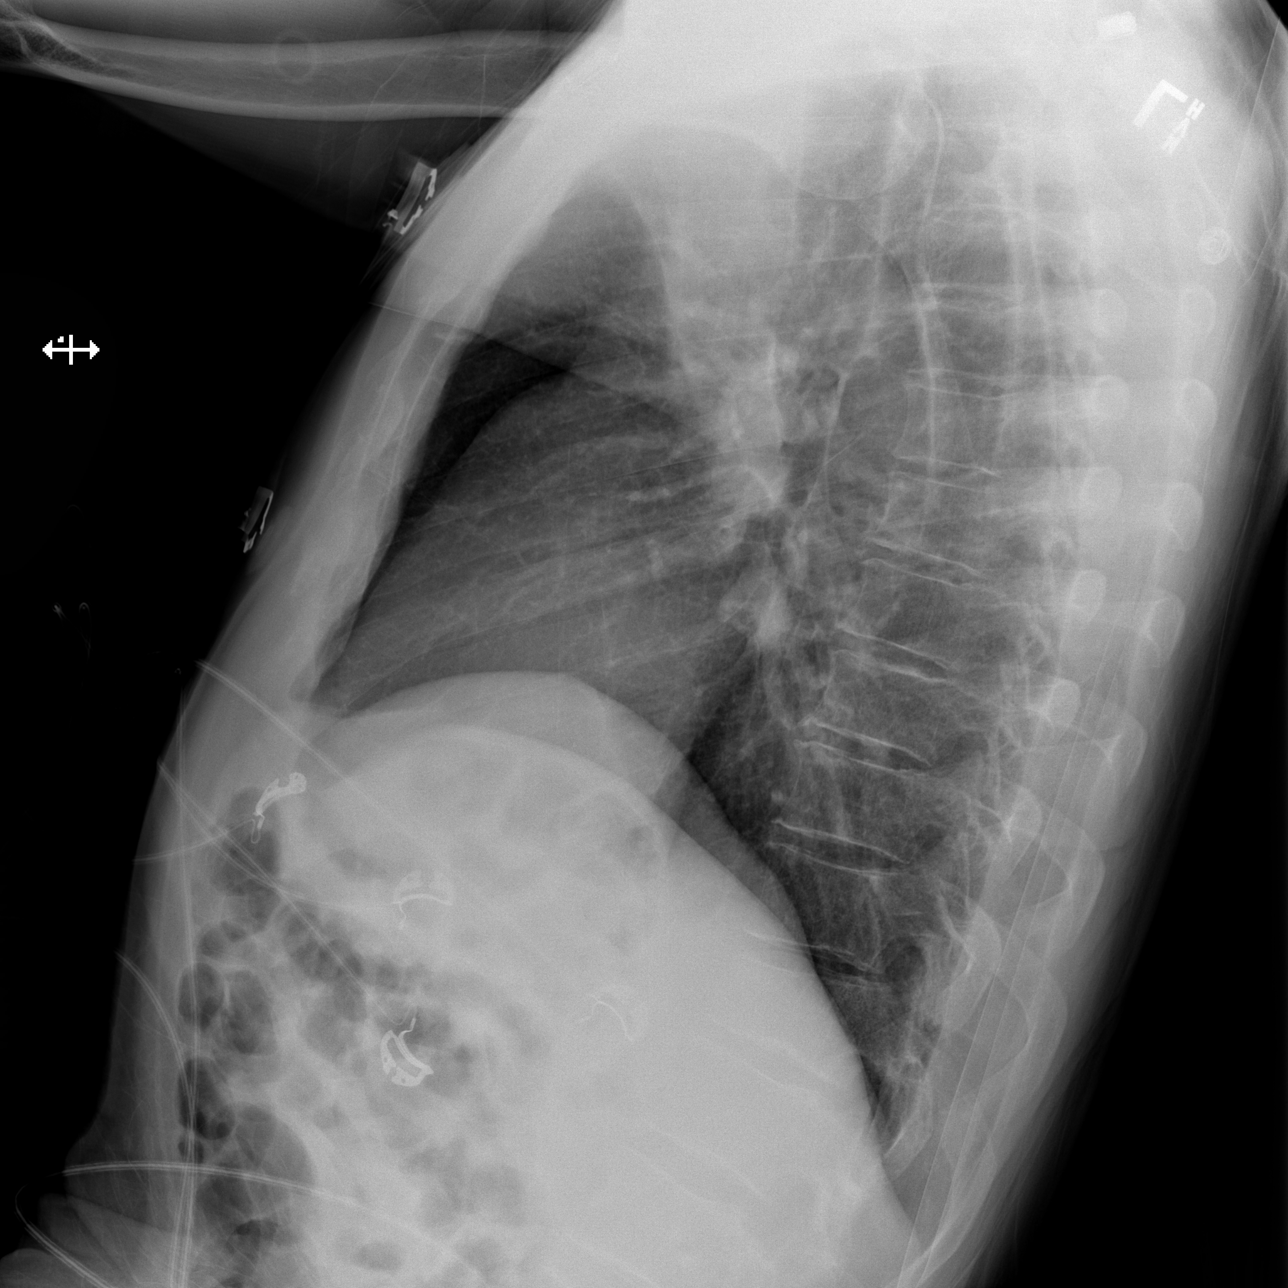

[x chest ap]
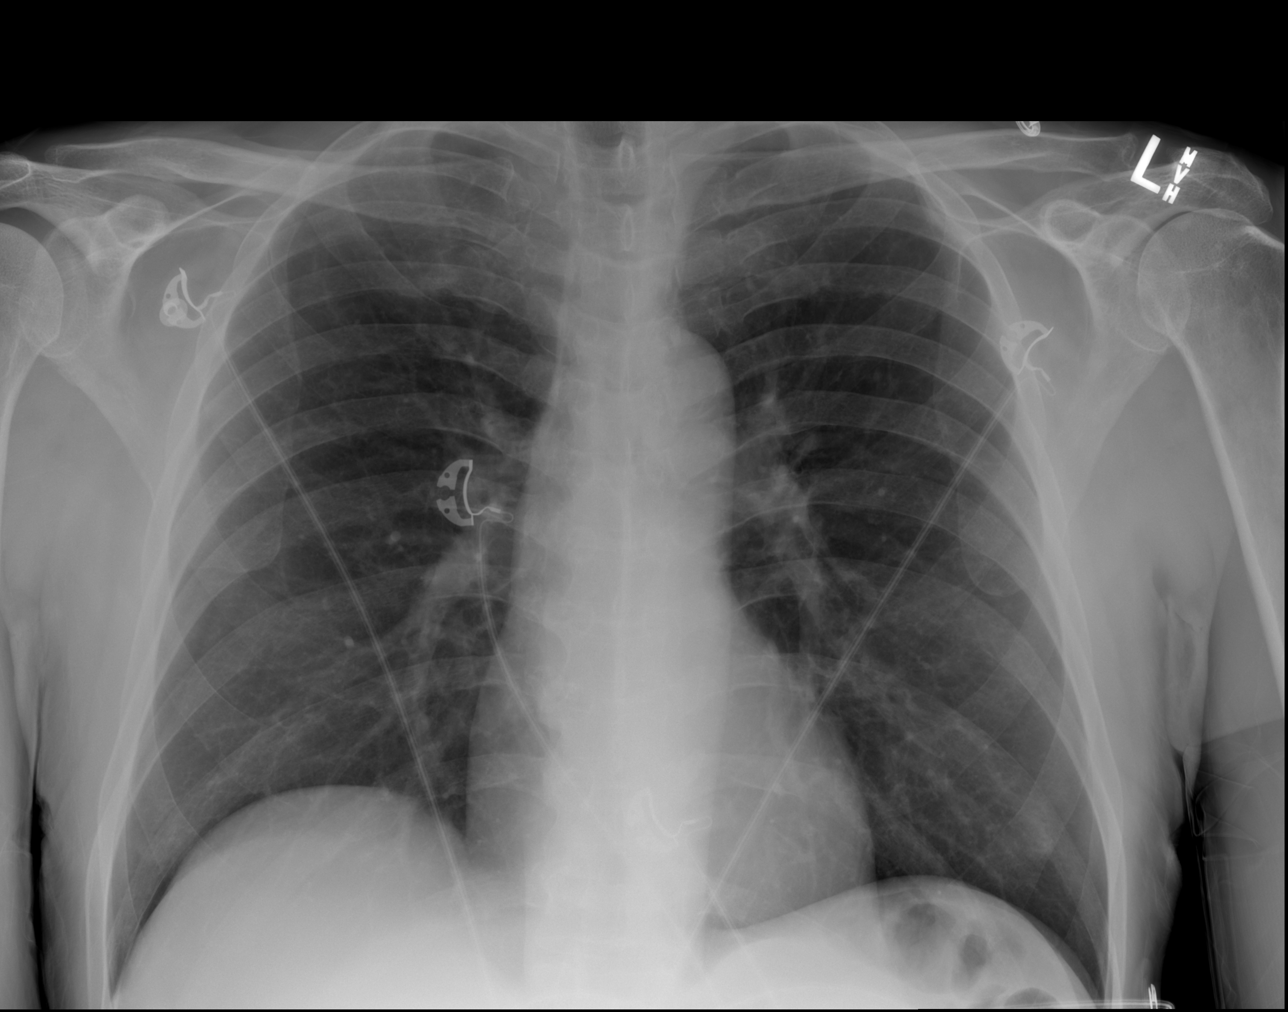

[3 of 3 positions shown; findings below may reference images not displayed]

FINDINGS: The heart size and mediastinal contours are within normal limits.
Both lungs are clear. Mild wedging of approximate T12.
IMPRESSION: No active cardiopulmonary disease.

## 2019-04-29 ENCOUNTER — Emergency Department (HOSPITAL_COMMUNITY)
Admission: EM | Admit: 2019-04-29 | Discharge: 2019-04-29 | Disposition: A | Discharge status: Home / Self Care | Payer: Self-pay | Attending: Emergency Medicine | Admitting: Emergency Medicine

## 2019-04-29 ENCOUNTER — Emergency Department (HOSPITAL_COMMUNITY): Payer: Self-pay

## 2019-04-29 ENCOUNTER — Other Ambulatory Visit: Payer: Self-pay

## 2019-04-29 ENCOUNTER — Encounter (HOSPITAL_COMMUNITY): Payer: Self-pay

## 2019-04-29 DIAGNOSIS — Y999 Unspecified external cause status: Secondary | ICD-10-CM | POA: Insufficient documentation

## 2019-04-29 DIAGNOSIS — S61412A Laceration without foreign body of left hand, initial encounter: Secondary | ICD-10-CM | POA: Insufficient documentation

## 2019-04-29 DIAGNOSIS — S61422A Laceration with foreign body of left hand, initial encounter: Secondary | ICD-10-CM

## 2019-04-29 DIAGNOSIS — Y9389 Activity, other specified: Secondary | ICD-10-CM | POA: Insufficient documentation

## 2019-04-29 DIAGNOSIS — I10 Essential (primary) hypertension: Secondary | ICD-10-CM | POA: Insufficient documentation

## 2019-04-29 DIAGNOSIS — Z79899 Other long term (current) drug therapy: Secondary | ICD-10-CM | POA: Insufficient documentation

## 2019-04-29 DIAGNOSIS — W25XXXA Contact with sharp glass, initial encounter: Secondary | ICD-10-CM | POA: Insufficient documentation

## 2019-04-29 DIAGNOSIS — Z87891 Personal history of nicotine dependence: Secondary | ICD-10-CM | POA: Insufficient documentation

## 2019-04-29 DIAGNOSIS — Y929 Unspecified place or not applicable: Secondary | ICD-10-CM | POA: Insufficient documentation

## 2019-04-29 MED ORDER — LIDOCAINE-EPINEPHRINE (PF) 2 %-1:200000 IJ SOLN
10.0000 mL | Freq: Once | INTRAMUSCULAR | Status: AC
  Administered 2019-04-29: 10 mL
  Filled 2019-04-29: qty 10

## 2019-04-29 NOTE — ED Notes (Signed)
Wound dressed. ?

## 2019-04-29 NOTE — ED Provider Notes (Signed)
[facility] DEPT Provider Note   CSN: [id number] Arrival date & time: 04/29/19  1903     History   Chief Complaint Chief Complaint  Patient presents with   Laceration    HPI Keith Ramos is a 63 y.o. male.  Presents emergency department with left hand laceration.  Patient reports earlier this evening he was attempting to unjam a window and accidentally broke it and his left hand came down on some glass.  Noted 2 lacerations to the palm of his left hand.  Denies any numbness, weakness in his hand.  States bleeding stopped with direct pressure.  Last tetanus was 2 years ago.  Denies injury anywhere else.     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 Authorizing Provider  amLODipine (NORVASC) 5 MG tablet Take 5 mg by mouth daily.    [provider]  dimenhyDRINATE (DRAMAMINE PO) Take 1 tablet by mouth daily as needed (nausea).    [provider]  feeding supplement, ENSURE ENLIVE, (ENSURE ENLIVE) LIQD Take 237 mLs by mouth 2 (two) times daily between meals. 03/25/18   Hosie Poisson, MD  LIDOCAINE EX Apply 1 application topically daily as needed (pain).     [provider]  lisinopril (PRINIVIL,ZESTRIL) 20 MG tablet Take 20 mg by mouth 2 (two) times daily.     [provider]  methocarbamol (ROBAXIN) 500 MG tablet Take 1 tablet (500 mg total) by mouth 4 (four) times daily. 03/24/18   Hosie Poisson, MD  omeprazole (PRILOSEC) 20 MG capsule Take 20 mg by mouth daily.    [provider]  tamsulosin (FLOMAX) 0.4 MG CAPS capsule Take 1 capsule (0.4 mg total) by mouth daily. 03/25/18   Hosie Poisson, MD  traMADol (ULTRAM) 50 MG tablet Take 50-100 mg by mouth every 8 (eight) hours as needed for moderate pain.  02/16/18   [provider]    Family History Family History  Problem Relation Age of Onset   Hepatitis B Father    Cirrhosis Father    Colon cancer Maternal Grandfather 10   Pancreatic cancer Maternal Grandmother    Colon cancer Cousin 38   Esophageal cancer Neg Hx    Stomach cancer Neg Hx     Social History Social History   Tobacco Use   Smoking status: Former Smoker   Smokeless tobacco: Never Used  Substance Use Topics   Alcohol use: Not on file    Comment: occasionally   Drug use: No     Allergies   Patient has no known allergies.   Review of Systems Review of Systems  Constitutional: Negative for chills and fever.  HENT: Negative for ear pain and sore throat.   Eyes: Negative for pain and visual disturbance.  Respiratory: Negative for cough and shortness of breath.   Cardiovascular: Negative for chest pain and palpitations.  Gastrointestinal: Negative for abdominal pain and vomiting.  Genitourinary: Negative for dysuria and hematuria.  Musculoskeletal: Negative for arthralgias and back pain.  Skin: Negative for color change and rash.       Laceration  Neurological: Negative for seizures and syncope.  All other systems reviewed and are negative.    Physical Exam Updated Vital Signs BP (!) 183/105 (BP Location: Left Arm)    Pulse 83    Temp 98 F (36.7 C) (Oral)    Resp  18    SpO2 99%   Physical Exam Vitals signs and nursing note reviewed.  Constitutional:      Appearance: He is well-developed.  HENT:     Head: Normocephalic and atraumatic.  Eyes:     Conjunctiva/sclera: Conjunctivae normal.  Neck:     Musculoskeletal: Neck supple.  Cardiovascular:     Comments: Intact distal pulses, good cap refill distally Pulmonary:     Effort: Pulmonary effort is normal. No respiratory distress.     Breath sounds: Normal breath sounds.  Abdominal:     Palpations: Abdomen is soft.     Tenderness: There is no abdominal tenderness.  Musculoskeletal:     Comments: 1cm laceration, 1.5 cm laceration over palmar surface left hand, finger extension, flexion intact, sensation intact throughout hand, fingers, distal cap refill intact throughout all fingers, 2+ radial pulse  Skin:    General: Skin is warm and dry.  Neurological:     Mental Status: He is alert.      ED Treatments / Results  Labs (all labs ordered are listed, but only abnormal results are displayed) Labs Reviewed - No data to display  EKG None  Radiology Dg Hand Complete Left  Result Date: 04/29/2019 CLINICAL DATA:  Cut hand on broken window, rule out foreign body EXAM: LEFT HAND - COMPLETE 3+ VIEW COMPARISON:  None. FINDINGS: No acute fracture or dislocation is noted. Small linear densities are noted on the oblique image between the first and second as well as the second and third metacarpals. These are only seen on the oblique image. Small glass shards could not be totally excluded. IMPRESSION: Question small glass shards as described. The changes are seen on only 1 image Electronically Signed   By: Inez Catalina M.D.   On: 04/29/2019 20:29    Procedures .Marland KitchenLaceration Repair  Date/Time: 04/30/2019 12:25 AM Performed by: Lucrezia Starch, MD Authorized by: Lucrezia Starch, MD   Consent:    Consent obtained:  Verbal   Consent given by:  Patient   Risks discussed:  Infection, nerve damage  and need for additional repair   Alternatives discussed:  No treatment Anesthesia (see MAR for exact dosages):    Anesthesia method:  None Laceration details:    Location:  Hand Repair type:    Repair type:  Simple Treatment:    Amount of cleaning:  Extensive   Irrigation solution:  Sterile saline   Visualized foreign bodies/material removed: yes   Skin repair:    Repair method:  Sutures   Suture size:  5-0   Suture material:  Nylon   Number of sutures:  7 Approximation:    Approximation:  Close   (including critical care time)  Medications Ordered in ED Medications  lidocaine-EPINEPHrine (XYLOCAINE W/EPI) 2 %-1:200000 (PF)  injection 10 mL (has no administration in time range)     Initial Impression / Assessment and Plan / ED Course  I have reviewed the triage vital signs and the nursing notes.  Pertinent labs & imaging results that were available during my care of the patient were reviewed by me and considered in my medical decision making (see chart for details).        63 year old male presents after suffering laceration to left hand, x-ray without fracture but suspicious for possible small glass in wound.  Performed extensive wound irrigation and bleed successfully removed small piece of glass.  Performed laceration repair.  Will need follow-up with primary doctor for suture removal and wound recheck.    After the discussed management above, the patient was determined to be safe for discharge.  The patient was in agreement with this plan and all questions regarding their care were answered.  ED return precautions were discussed and the patient will return to the ED with any significant worsening of condition.    Final Clinical Impressions(s) / ED Diagnoses   Final diagnoses:  Laceration of left hand with foreign body, initial encounter    ED Discharge Orders    None       Lucrezia Starch, MD 04/30/19 [id number]

## 2019-04-29 NOTE — Discharge Instructions (Signed)
Please keep the hand clean and dry, follow dressing changes as discussed.  If you develop redness, swelling, pus drainage from your laceration site, please return to the emergency department for reassessment as this could be a sign of infection.  May run gentle water over but avoid soaking, particularly no swimming in pools or bodies of water.  Water.

## 2019-04-29 NOTE — ED Triage Notes (Addendum)
Pt presents with L hand laceration. States that he was trying to unjam a window and it broke. 2 cuts noted to palm. States that his last tetanus shot was 2 years ago. Able to move all of his fingers, but he states that he feels a "pulling sensation." Bleeding controlled.

## 2019-10-13 IMAGING — CR LEFT HAND - COMPLETE 3+ VIEW
3 series · 3 of 3 positions shown · non-contrast
Comparison: None.

CLINICAL DATA: Cut hand on broken window, rule out foreign body

EXAM:
LEFT HAND - COMPLETE 3+ VIEW

[x hand pa left]
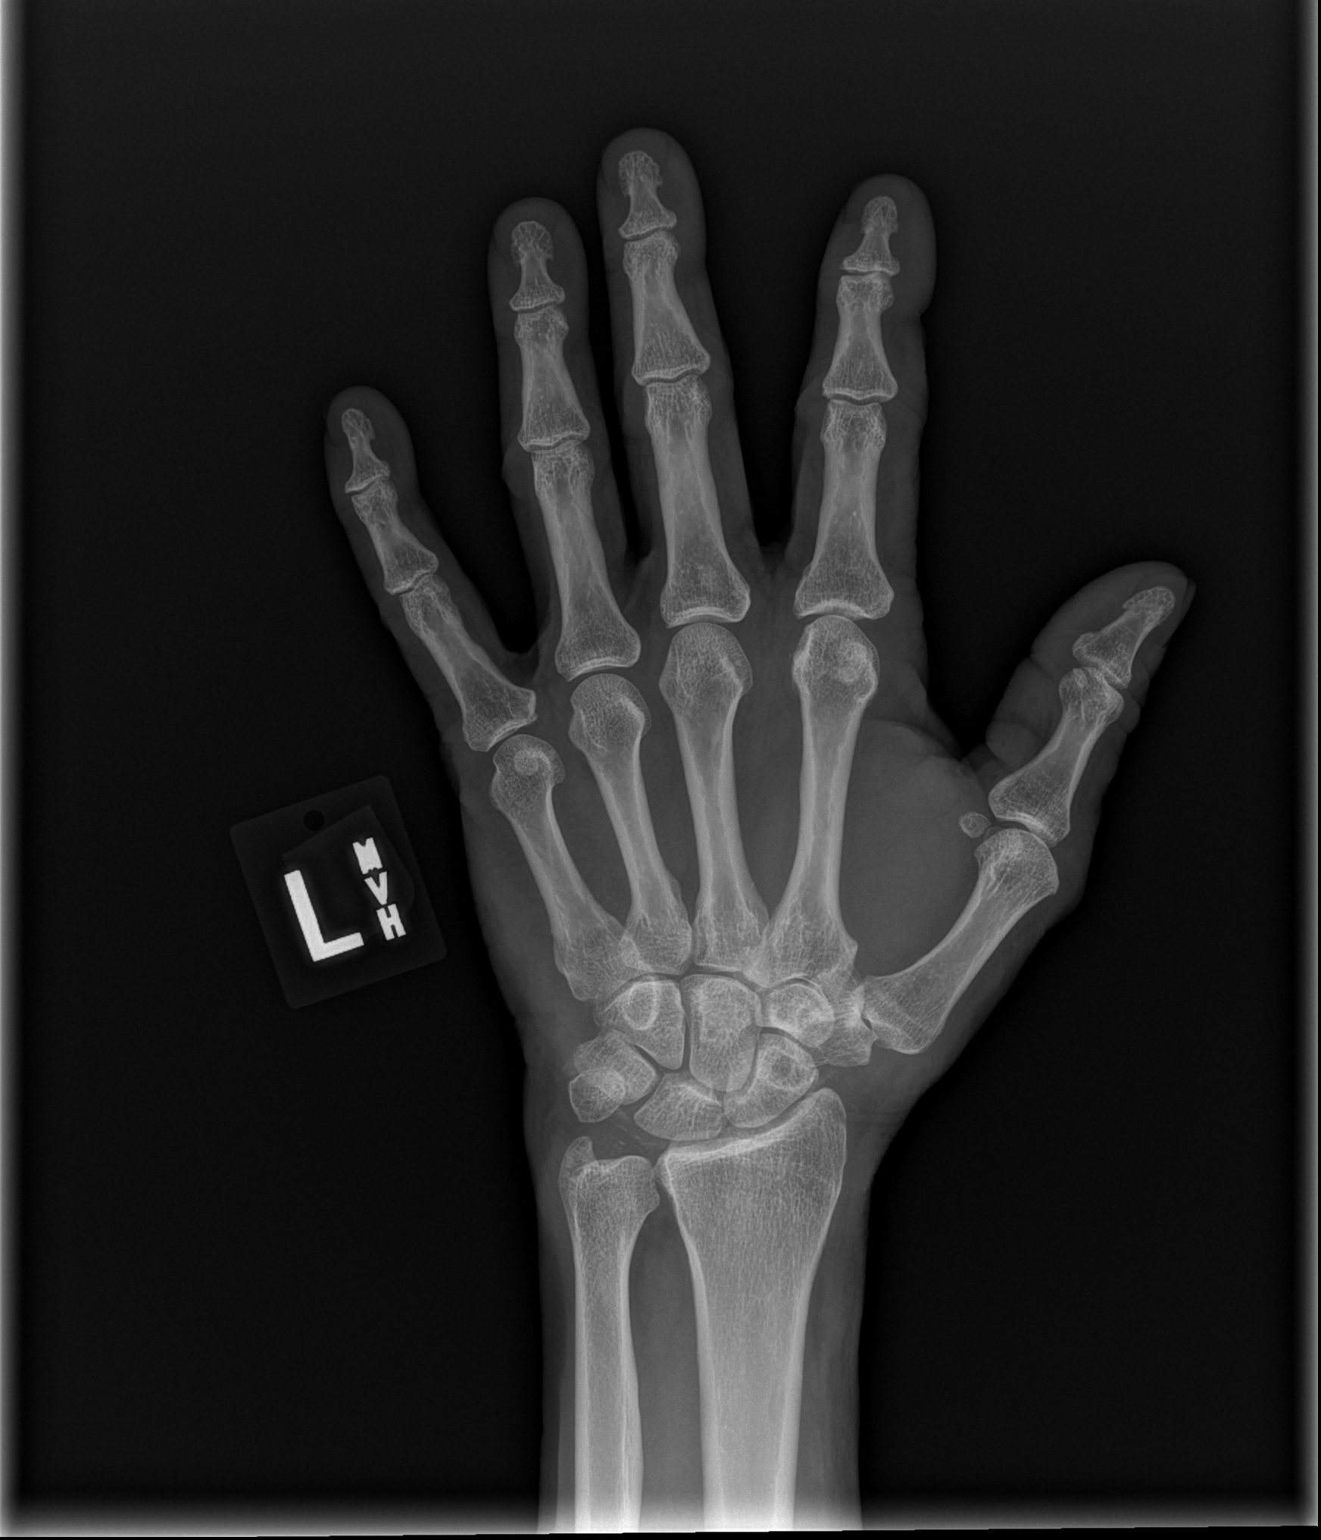

[x hand obl left]
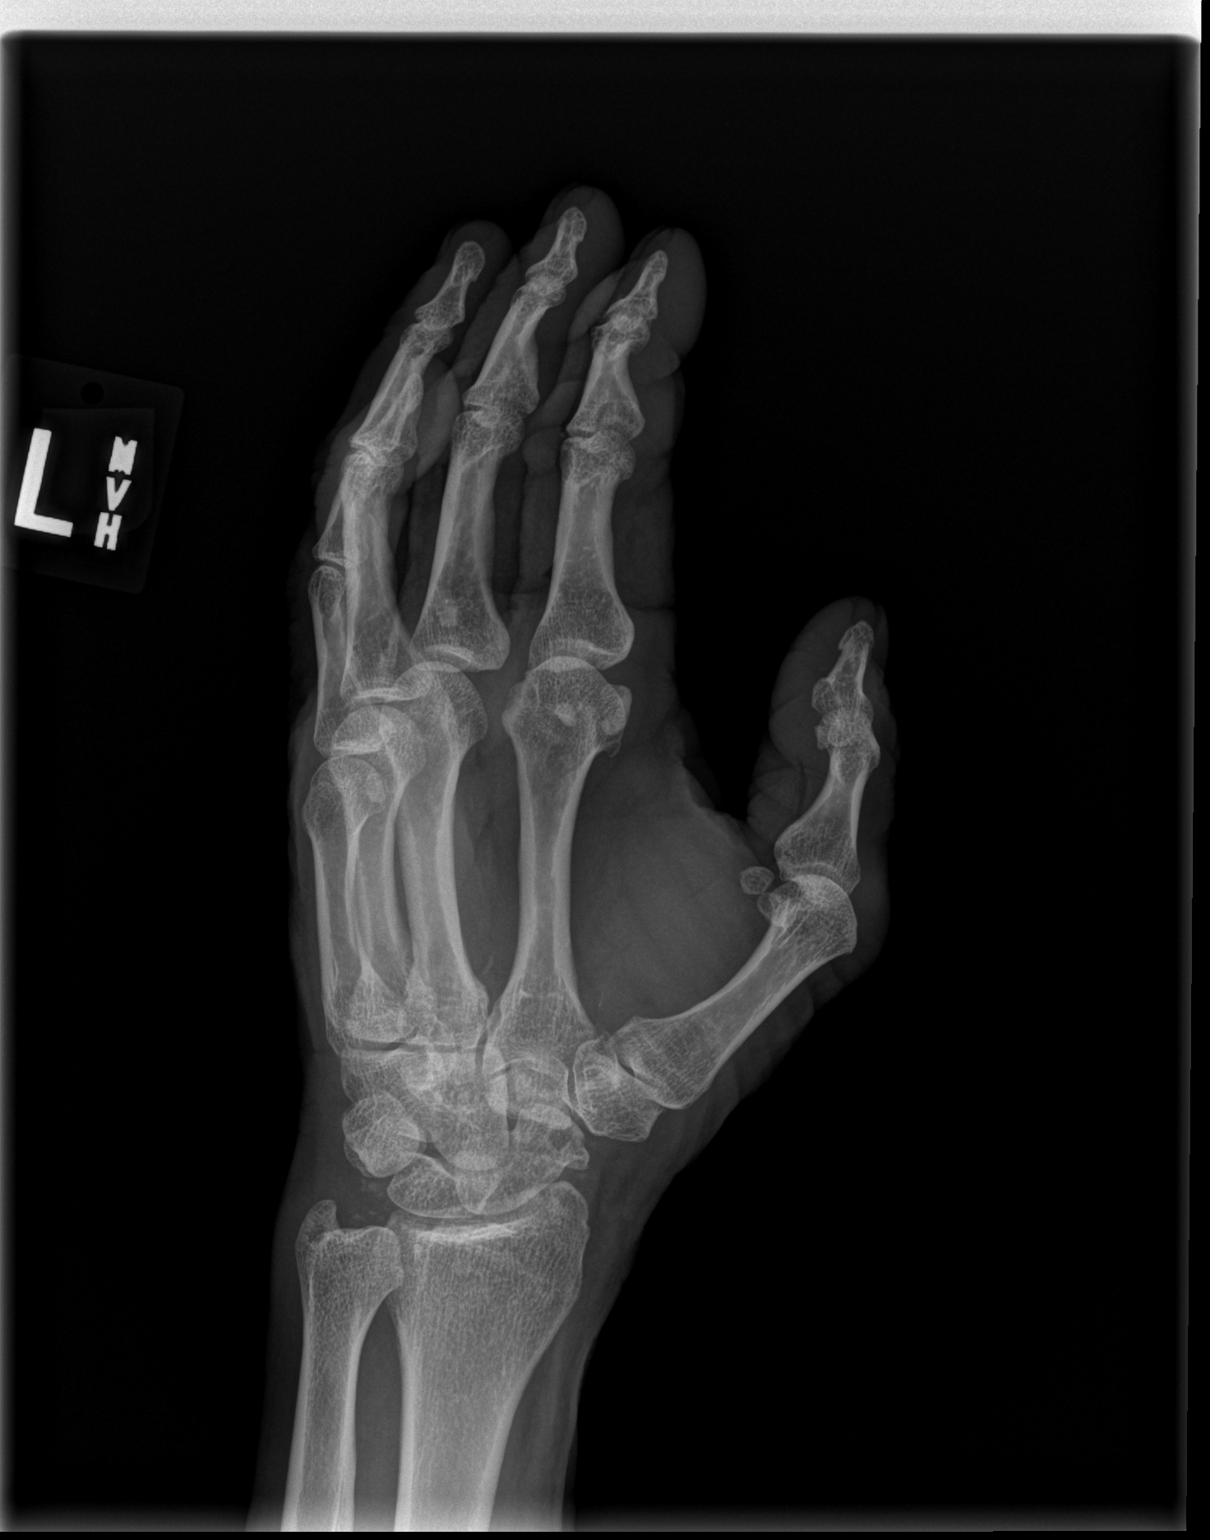

[x hand lat left]
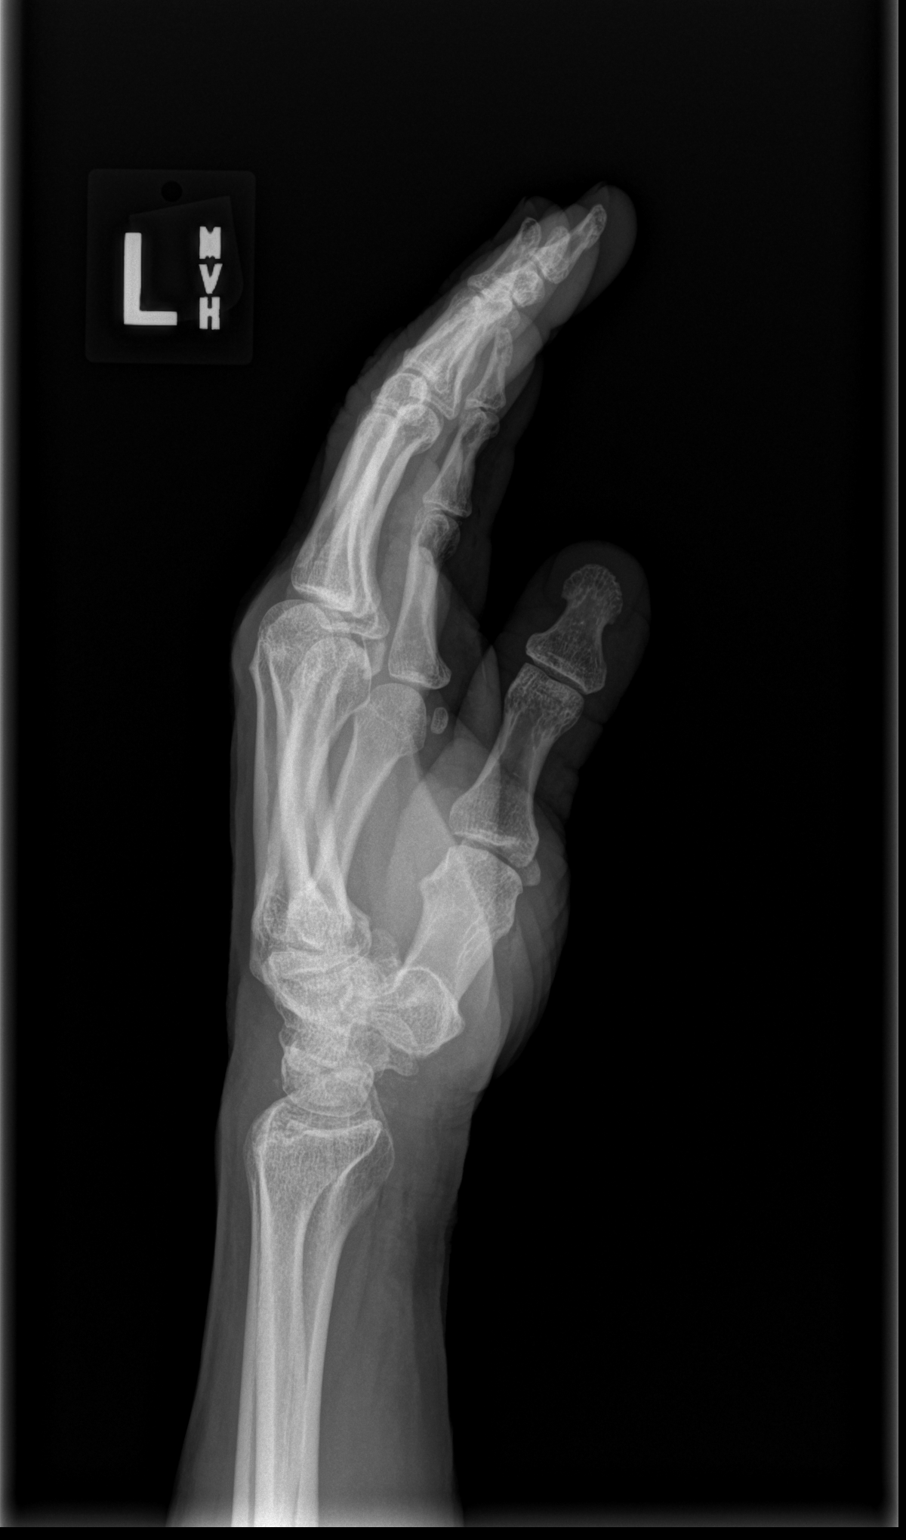

[3 of 3 positions shown; findings below may reference images not displayed]

FINDINGS: No acute fracture or dislocation is noted. Small linear densities
are noted on the oblique image between the first and second as well
as the second and third metacarpals. These are only seen on the
oblique image. Small glass shards could not be totally excluded.
IMPRESSION: Question small glass shards as described. The changes are seen on
only 1 image

## 2021-03-28 ENCOUNTER — Encounter: Payer: Self-pay | Admitting: Internal Medicine

## 2021-06-22 DIAGNOSIS — L299 Pruritus, unspecified: Secondary | ICD-10-CM | POA: Diagnosis not present

## 2021-06-22 DIAGNOSIS — I10 Essential (primary) hypertension: Secondary | ICD-10-CM | POA: Diagnosis not present

## 2021-06-22 DIAGNOSIS — L509 Urticaria, unspecified: Secondary | ICD-10-CM | POA: Diagnosis not present

## 2021-06-22 DIAGNOSIS — L539 Erythematous condition, unspecified: Secondary | ICD-10-CM | POA: Diagnosis not present

## 2021-07-01 DIAGNOSIS — R21 Rash and other nonspecific skin eruption: Secondary | ICD-10-CM | POA: Diagnosis not present

## 2021-07-01 DIAGNOSIS — L821 Other seborrheic keratosis: Secondary | ICD-10-CM | POA: Diagnosis not present

## 2021-07-01 DIAGNOSIS — L309 Dermatitis, unspecified: Secondary | ICD-10-CM | POA: Diagnosis not present

## 2021-07-31 DIAGNOSIS — L821 Other seborrheic keratosis: Secondary | ICD-10-CM | POA: Diagnosis not present

## 2021-07-31 DIAGNOSIS — L578 Other skin changes due to chronic exposure to nonionizing radiation: Secondary | ICD-10-CM | POA: Diagnosis not present

## 2021-07-31 DIAGNOSIS — R21 Rash and other nonspecific skin eruption: Secondary | ICD-10-CM | POA: Diagnosis not present

## 2021-07-31 DIAGNOSIS — L309 Dermatitis, unspecified: Secondary | ICD-10-CM | POA: Diagnosis not present

## 2021-07-31 DIAGNOSIS — L814 Other melanin hyperpigmentation: Secondary | ICD-10-CM | POA: Diagnosis not present

## 2021-07-31 DIAGNOSIS — D229 Melanocytic nevi, unspecified: Secondary | ICD-10-CM | POA: Diagnosis not present

## 2022-07-28 ENCOUNTER — Emergency Department: Payer: Medicare Other

## 2022-07-28 ENCOUNTER — Inpatient Hospital Stay
Admission: EM | Admit: 2022-07-28 | Discharge: 2022-07-31 | DRG: 728 | Disposition: A | Discharge status: Home / Self Care | Payer: Medicare Other | Attending: Internal Medicine | Admitting: Internal Medicine

## 2022-07-28 ENCOUNTER — Other Ambulatory Visit: Payer: Self-pay

## 2022-07-28 DIAGNOSIS — N451 Epididymitis: Principal | ICD-10-CM

## 2022-07-28 DIAGNOSIS — I10 Essential (primary) hypertension: Secondary | ICD-10-CM | POA: Insufficient documentation

## 2022-07-28 DIAGNOSIS — E860 Dehydration: Secondary | ICD-10-CM | POA: Diagnosis present

## 2022-07-28 DIAGNOSIS — Z8379 Family history of other diseases of the digestive system: Secondary | ICD-10-CM

## 2022-07-28 DIAGNOSIS — R000 Tachycardia, unspecified: Secondary | ICD-10-CM

## 2022-07-28 DIAGNOSIS — Z2089 Contact with and (suspected) exposure to other communicable diseases: Secondary | ICD-10-CM | POA: Diagnosis present

## 2022-07-28 DIAGNOSIS — R109 Unspecified abdominal pain: Secondary | ICD-10-CM | POA: Diagnosis not present

## 2022-07-28 DIAGNOSIS — Z87891 Personal history of nicotine dependence: Secondary | ICD-10-CM

## 2022-07-28 DIAGNOSIS — Z831 Family history of other infectious and parasitic diseases: Secondary | ICD-10-CM

## 2022-07-28 DIAGNOSIS — N400 Enlarged prostate without lower urinary tract symptoms: Secondary | ICD-10-CM | POA: Insufficient documentation

## 2022-07-28 DIAGNOSIS — N200 Calculus of kidney: Secondary | ICD-10-CM | POA: Diagnosis present

## 2022-07-28 DIAGNOSIS — E876 Hypokalemia: Secondary | ICD-10-CM | POA: Diagnosis present

## 2022-07-28 DIAGNOSIS — Z20822 Contact with and (suspected) exposure to covid-19: Secondary | ICD-10-CM | POA: Diagnosis present

## 2022-07-28 DIAGNOSIS — A419 Sepsis, unspecified organism: Secondary | ICD-10-CM

## 2022-07-28 DIAGNOSIS — R824 Acetonuria: Secondary | ICD-10-CM | POA: Diagnosis present

## 2022-07-28 DIAGNOSIS — Z79899 Other long term (current) drug therapy: Secondary | ICD-10-CM

## 2022-07-28 DIAGNOSIS — R1114 Bilious vomiting: Secondary | ICD-10-CM

## 2022-07-28 DIAGNOSIS — Z800 Family history of malignant neoplasm of digestive organs: Secondary | ICD-10-CM

## 2022-07-28 DIAGNOSIS — Z87442 Personal history of urinary calculi: Secondary | ICD-10-CM

## 2022-07-28 DIAGNOSIS — R112 Nausea with vomiting, unspecified: Secondary | ICD-10-CM | POA: Diagnosis present

## 2022-07-28 LAB — TROPONIN I (HIGH SENSITIVITY): Troponin I (High Sensitivity): 5 ng/L (ref <18)

## 2022-07-28 LAB — CBC
HCT: 41.1 % (ref 39.0–52.0)
Hemoglobin: 14.5 g/dL (ref 13.0–17.0)
MCH: 30.7 pg (ref 26.0–34.0)
MCHC: 35.3 g/dL (ref 30.0–36.0)
MCV: 86.9 fL (ref 80.0–100.0)
Platelets: 252 10*3/uL (ref 150–400)
RBC: 4.73 MIL/uL (ref 4.22–5.81)
RDW: 12.4 % (ref 11.5–15.5)
WBC: 11.9 10*3/uL — ABNORMAL HIGH (ref 4.0–10.5)
nRBC: 0 % (ref 0.0–0.2)

## 2022-07-28 LAB — URINALYSIS, ROUTINE W REFLEX MICROSCOPIC
Bilirubin Urine: NEGATIVE
Glucose, UA: NEGATIVE mg/dL
Hgb urine dipstick: NEGATIVE
Ketones, ur: 5 mg/dL — AB
Leukocytes,Ua: NEGATIVE
Nitrite: NEGATIVE
Protein, ur: NEGATIVE mg/dL
Specific Gravity, Urine: 1.03 (ref 1.005–1.030)
pH: 7 (ref 5.0–8.0)

## 2022-07-28 LAB — COMPREHENSIVE METABOLIC PANEL
ALT: 30 U/L (ref 0–44)
AST: 31 U/L (ref 15–41)
Albumin: 4.7 g/dL (ref 3.5–5.0)
Alkaline Phosphatase: 66 U/L (ref 38–126)
Anion gap: 13 (ref 5–15)
BUN: 12 mg/dL (ref 8–23)
CO2: 22 mmol/L (ref 22–32)
Calcium: 9.8 mg/dL (ref 8.9–10.3)
Chloride: 103 mmol/L (ref 98–111)
Creatinine, Ser: 0.82 mg/dL (ref 0.61–1.24)
GFR, Estimated: >60 mL/min (ref >60)
Glucose, Bld: 147 mg/dL — ABNORMAL HIGH (ref 70–99)
Potassium: 4.1 mmol/L (ref 3.5–5.1)
Sodium: 138 mmol/L (ref 135–145)
Total Bilirubin: 0.7 mg/dL (ref 0.3–1.2)
Total Protein: 8.4 g/dL — ABNORMAL HIGH (ref 6.5–8.1)

## 2022-07-28 LAB — RESP PANEL BY RT-PCR (FLU A&B, COVID) ARPGX2
Influenza A by PCR: NEGATIVE
Influenza B by PCR: NEGATIVE
SARS Coronavirus 2 by RT PCR: NEGATIVE

## 2022-07-28 LAB — LACTIC ACID, PLASMA: Lactic Acid, Venous: 2 mmol/L (ref 0.5–1.9)

## 2022-07-28 LAB — LIPASE, BLOOD: Lipase: 28 U/L (ref 11–51)

## 2022-07-28 MED ORDER — ACETAMINOPHEN 325 MG PO TABS
650.0000 mg | ORAL_TABLET | Freq: Four times a day (QID) | ORAL | Status: DC | PRN
  Administered 2022-07-29: 650 mg via ORAL
  Filled 2022-07-28: qty 2

## 2022-07-28 MED ORDER — AMLODIPINE BESYLATE 5 MG PO TABS
5.0000 mg | ORAL_TABLET | Freq: Every day | ORAL | Status: DC
  Administered 2022-07-29 – 2022-07-31 (×3): 5 mg via ORAL
  Filled 2022-07-28 (×3): qty 1

## 2022-07-28 MED ORDER — LISINOPRIL 20 MG PO TABS
20.0000 mg | ORAL_TABLET | Freq: Two times a day (BID) | ORAL | Status: DC
  Administered 2022-07-28 – 2022-07-31 (×6): 20 mg via ORAL
  Filled 2022-07-28 (×5): qty 1
  Filled 2022-07-28: qty 2

## 2022-07-28 MED ORDER — ONDANSETRON HCL 4 MG/2ML IJ SOLN
4.0000 mg | Freq: Once | INTRAMUSCULAR | Status: AC
  Administered 2022-07-28: 4 mg via INTRAVENOUS
  Filled 2022-07-28: qty 2

## 2022-07-28 MED ORDER — MORPHINE SULFATE (PF) 4 MG/ML IV SOLN
4.0000 mg | Freq: Once | INTRAVENOUS | Status: AC
  Administered 2022-07-28: 4 mg via INTRAVENOUS
  Filled 2022-07-28: qty 1

## 2022-07-28 MED ORDER — POLYETHYLENE GLYCOL 3350 17 G PO PACK: 17.0000 g | PACK | Freq: Every day | ORAL | Status: DC | PRN

## 2022-07-28 MED ORDER — METRONIDAZOLE 500 MG/100ML IV SOLN
500.0000 mg | Freq: Once | INTRAVENOUS | Status: AC
  Administered 2022-07-28: 500 mg via INTRAVENOUS
  Filled 2022-07-28: qty 100

## 2022-07-28 MED ORDER — ONDANSETRON HCL 4 MG PO TABS: 4.0000 mg | ORAL_TABLET | Freq: Four times a day (QID) | ORAL | Status: DC | PRN

## 2022-07-28 MED ORDER — ENOXAPARIN SODIUM 40 MG/0.4ML IJ SOSY
40.0000 mg | PREFILLED_SYRINGE | INTRAMUSCULAR | Status: DC
  Administered 2022-07-28 – 2022-07-30 (×3): 40 mg via SUBCUTANEOUS
  Filled 2022-07-28 (×3): qty 0.4

## 2022-07-28 MED ORDER — ACETAMINOPHEN 500 MG PO TABS
1000.0000 mg | ORAL_TABLET | Freq: Once | ORAL | Status: AC
  Administered 2022-07-28: 1000 mg via ORAL
  Filled 2022-07-28: qty 2

## 2022-07-28 MED ORDER — SODIUM CHLORIDE 0.9 % IV BOLUS
1000.0000 mL | Freq: Once | INTRAVENOUS | Status: AC
  Administered 2022-07-28: 1000 mL via INTRAVENOUS

## 2022-07-28 MED ORDER — VANCOMYCIN HCL IN DEXTROSE 1-5 GM/200ML-% IV SOLN
1000.0000 mg | Freq: Once | INTRAVENOUS | Status: AC
  Administered 2022-07-28: 1000 mg via INTRAVENOUS
  Filled 2022-07-28: qty 200

## 2022-07-28 MED ORDER — SODIUM CHLORIDE 0.9 % IV SOLN
2.0000 g | Freq: Once | INTRAVENOUS | Status: AC
  Administered 2022-07-28: 2 g via INTRAVENOUS
  Filled 2022-07-28: qty 12.5

## 2022-07-28 MED ORDER — SODIUM CHLORIDE 0.9% FLUSH
3.0000 mL | Freq: Two times a day (BID) | INTRAVENOUS | Status: DC
  Administered 2022-07-28 – 2022-07-31 (×6): 3 mL via INTRAVENOUS

## 2022-07-28 MED ORDER — SODIUM CHLORIDE 0.9 % IV SOLN: Freq: Once | INTRAVENOUS | Status: AC

## 2022-07-28 MED ORDER — KETOROLAC TROMETHAMINE 30 MG/ML IJ SOLN
30.0000 mg | Freq: Once | INTRAMUSCULAR | Status: AC
  Administered 2022-07-28: 30 mg via INTRAVENOUS
  Filled 2022-07-28: qty 1

## 2022-07-28 MED ORDER — DROPERIDOL 2.5 MG/ML IJ SOLN
1.2500 mg | Freq: Once | INTRAMUSCULAR | Status: AC
  Administered 2022-07-28: 1.25 mg via INTRAVENOUS
  Filled 2022-07-28: qty 2

## 2022-07-28 MED ORDER — ACETAMINOPHEN 650 MG RE SUPP: 650.0000 mg | Freq: Four times a day (QID) | RECTAL | Status: DC | PRN

## 2022-07-28 MED ORDER — IOHEXOL 300 MG/ML  SOLN
100.0000 mL | Freq: Once | INTRAMUSCULAR | Status: AC | PRN
  Administered 2022-07-28: 100 mL via INTRAVENOUS

## 2022-07-28 MED ORDER — PANTOPRAZOLE SODIUM 40 MG PO TBEC
40.0000 mg | DELAYED_RELEASE_TABLET | Freq: Every day | ORAL | Status: DC
  Administered 2022-07-28 – 2022-07-31 (×4): 40 mg via ORAL
  Filled 2022-07-28 (×4): qty 1

## 2022-07-28 MED ORDER — ONDANSETRON HCL 4 MG/2ML IJ SOLN: 4.0000 mg | Freq: Four times a day (QID) | INTRAMUSCULAR | Status: DC | PRN

## 2022-07-28 NOTE — H&P (Incomplete)
History and Physical    Patient: Keith Ramos XBJ:[id number] DOB: 1956-05-19 DOA: 07/28/2022 DOS: the patient was seen and examined on 07/28/2022 PCP: Albina Billet, MD  Patient coming from: Home  Chief Complaint:  Chief Complaint  Patient presents with  . Flank Pain   HPI: Keith Ramos is a 66 y.o. male with medical history significant of hypertension and prior kidney stones presents to the ED with complaints of nausea and vomiting.  Keith Ramos states he was in his normal state of health over the last several days and visited his grandchildren.  He notes that one of his grandchildren had an upper respiratory infection of unknown etiology.  When returning home on 11/05, he began to develop right lower back pain that transitioned into groin pain.  He was concern for possible kidney stone as he has had these in the past and felt similar.  He subsequently began to experience nausea and vomiting that was nonbloody and nonmelanotic.  Vomiting persisted throughout the day and due to this, he sought care in the ED.  He endorses subjective fevers at home and chills but otherwise denies any shortness of breath, chest pain, palpitations, diarrhea, dysuria, hematuria.  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  He denies any recent dental work or joint injections.  ED course: On arrival to the ED, patient was febrile at 100.8 with heart rate of 114 and blood pressure 169/91.  He was saturating at 96% on room air.  Initial blood work demonstrated normal electrolytes and renal function.  White blood count elevated at 11.9.  Urinalysis notable for ketonuria only.CT renal study was obtained that demonstrated small nonobstructive right renal calculi.  CT abdomen/pelvis was subsequently obtained that demonstrated no evidence of intestinal obstruction or pneumoperitoneum.  No findings to explain patient's symptoms.  Chest x-ray was obtained that was  negative.  Due to concern for sepsis, [facility] contacted for admission.  Review of Systems: As mentioned in the history of present illness. All other systems reviewed and are negative. Past Medical History:  Diagnosis Date  . Hypertension    Past Surgical History:  Procedure Laterality Date  . HERNIA REPAIR  1977   66 yrs old multiple times since  . KNEE ARTHROSCOPY Left 2007   Social History:  reports that he has quit smoking. He has never used smokeless tobacco. He reports that he does not use drugs. No history on file for alcohol use.  No Known Allergies  Family History  Problem Relation Age of Onset  . Hepatitis B Father   . Cirrhosis Father   . Colon cancer Maternal Grandfather 73  . Pancreatic cancer Maternal Grandmother   . Colon cancer Cousin 33  . Esophageal cancer Neg Hx   . Stomach cancer Neg Hx     Prior to Admission medications   Medication Sig Start Date End Date Taking? Authorizing Provider  amLODipine (NORVASC) 5 MG tablet Take 5 mg by mouth daily. Patient not taking: Reported on 07/28/2022    [provider]  dimenhyDRINATE (DRAMAMINE PO) Take 1 tablet by mouth daily as needed (nausea). Patient not taking: Reported on 07/28/2022    [provider]  LIDOCAINE EX Apply 1 application topically daily as needed (pain). Patient not taking: Reported on 07/28/2022    [provider]  lisinopril (PRINIVIL,ZESTRIL) 20 MG tablet Take 20 mg by mouth 2 (two) times daily.  Patient not taking: Reported  on 07/28/2022    [provider]  omeprazole (PRILOSEC) 20 MG capsule Take 20 mg by mouth daily. Patient not taking: Reported on 07/28/2022    [provider]  traMADol (ULTRAM) 50 MG tablet Take 50-100 mg by mouth every 8 (eight) hours as needed for moderate pain.  Patient not taking: Reported on 07/28/2022 02/16/18   [provider]    Physical Exam: Vitals:   07/28/22 1331 07/28/22 1647 07/28/22 2044 07/28/22 2213  BP:  (!)  169/91  (!) 145/87  Pulse:  (!) 114  92  Resp:  16  18  Temp:  (!) 100.8 F (38.2 C) 99.8 F (37.7 C) 98.3 F (36.8 C)  TempSrc:  Oral Oral Oral  SpO2:  96%  95%  Weight: 65.8 kg     Height: '5\' 7"'$  (1.702 m)      Physical Exam Vitals and nursing note reviewed.  Constitutional:      General: He is not in acute distress.    Appearance: He is normal weight. He is not toxic-appearing.  HENT:     Head: Normocephalic and atraumatic.     Mouth/Throat:     Mouth: Mucous membranes are dry.     Pharynx: Oropharynx is clear.  Eyes:     Extraocular Movements: Extraocular movements intact.     Conjunctiva/sclera: Conjunctivae normal.     Pupils: Pupils are equal, round, and reactive to light.  Cardiovascular:     Rate and Rhythm: Regular rhythm. Tachycardia present.     Heart sounds: No murmur heard.    No gallop.  Pulmonary:     Effort: Pulmonary effort is normal. No respiratory distress.     Breath sounds: Normal breath sounds. No wheezing or rales.  Abdominal:     General: Bowel sounds are decreased.     Palpations: Abdomen is soft.     Tenderness: There is no abdominal tenderness. There is no right CVA tenderness, left CVA tenderness or guarding.     Hernia: No hernia is present.  Musculoskeletal:        General: No tenderness or signs of injury.     Right lower leg: No edema.     Left lower leg: No edema.  Skin:    General: Skin is warm and dry.     Comments: Minimal excoriations on bilateral upper extremities with no surrounding erythema.  Neurological:     General: No focal deficit present.     Mental Status: He is alert and oriented to person, place, and time. Mental status is at baseline.  Psychiatric:        Mood and Affect: Mood normal.        Behavior: Behavior normal.        Thought Content: Thought content normal.        Judgment: Judgment normal.    Data Reviewed: CBC notable for white blood count of 11.9, but normal hemoglobin and platelets.  CMP remarkable  for glucose of 147 and total protein of 8.4 but no other electrolyte abnormalities, renal dysfunction or hepatic dysfunction noted.  Troponin within normal limits.  Respiratory viral panel including flu and COVID-negative.  Lipase within normal limits.  Urinalysis with ketonuria but no leukocytes, WBC or RBC.  Lactic acid elevated at 2.0.  EKG personally reviewed.  Consistent with sinus rhythm with a rate of 110.  Nonspecific repolarization changes.  No acute ST or T wave changes to suggest acute ischemia.  DG Chest 2 View  Result Date:  07/28/2022 CLINICAL DATA:  Fever EXAM: CHEST - 2 VIEW COMPARISON:  03/22/2018 FINDINGS: The heart size and mediastinal contours are within normal limits. Both lungs are clear. Unchanged elevation of the right hemidiaphragm. The visualized skeletal structures are unremarkable. IMPRESSION: No acute abnormality of the lungs. Unchanged elevation of the right hemidiaphragm. Electronically Signed   By: Delanna Ahmadi M.D.   On: 07/28/2022 18:26   CT ABDOMEN PELVIS W CONTRAST  Result Date: 07/28/2022 CLINICAL DATA:  Left flank pain EXAM: CT ABDOMEN AND PELVIS WITH CONTRAST TECHNIQUE: Multidetector CT imaging of the abdomen and pelvis was performed using the standard protocol following bolus administration of intravenous contrast. RADIATION DOSE REDUCTION: This exam was performed according to the departmental dose-optimization program which includes automated exposure control, adjustment of the mA and/or kV according to patient size and/or use of iterative reconstruction technique. CONTRAST:  136m OMNIPAQUE IOHEXOL 300 MG/ML  SOLN COMPARISON:  Noncontrast study done earlier today FINDINGS: Lower chest: Visualized lower lung fields are essentially clear. Small linear densities in right middle lobe may suggest minimal scarring or minimal subsegmental atelectasis. Coronary artery calcifications are seen. Hepatobiliary: There is fatty infiltration in liver. There is no dilation of bile  ducts. Gallbladder is unremarkable. Pancreas: No focal abnormalities are seen. Spleen: Unremarkable. Adrenals/Urinary Tract: Adrenals are unremarkable. There is no hydronephrosis. There is 3 mm calculus in the upper pole of right kidney. Ureters are unremarkable. Urinary bladder is distended. There is no wall thickening in the bladder. Stomach/Bowel: Stomach is moderately distended with fluid. There is no significant small bowel dilation. Appendix is not dilated. Multiple diverticula are seen in colon. There is no evidence of focal acute diverticulitis. Vascular/Lymphatic: Scattered arterial calcifications are seen. Reproductive: Prostate is enlarged. Other: There is no ascites or pneumoperitoneum. Small umbilical hernia containing fat is seen. There is previous repair of left inguinal hernia. Musculoskeletal: There is slight decrease in height of upper endplate of body of [id number] along with Schmorl's node. This may be residual from previous injury. Degenerative changes are noted with encroachment of neural foramina in lower lumbar spine. IMPRESSION: There is no evidence of intestinal obstruction or pneumoperitoneum. Appendix is not dilated. There is no hydronephrosis. There is small right renal calculus. Fatty liver. Diverticulosis of colon without signs of focal diverticulitis. Lumbar spondylosis. Aortic atherosclerosis. Coronary artery disease. Decrease in height of body of T11 vertebra may be residual from previous injury. Other findings as described in the body of the report. Electronically Signed   By: PElmer PickerM.D.   On: 07/28/2022 16:38   CT Renal Stone Study  Result Date: 07/28/2022 CLINICAL DATA:  Right flank pain for 2 days, kidney stones suspected EXAM: CT ABDOMEN AND PELVIS WITHOUT CONTRAST TECHNIQUE: Multidetector CT imaging of the abdomen and pelvis was performed following the standard protocol without IV contrast. RADIATION DOSE REDUCTION: This exam was performed according to the  departmental dose-optimization program which includes automated exposure control, adjustment of the mA and/or kV according to patient size and/or use of iterative reconstruction technique. COMPARISON:  03/22/2018 FINDINGS: Lower chest: No acute abnormality. Three-vessel coronary artery calcifications Hepatobiliary: No solid liver abnormality is seen. No gallstones, gallbladder wall thickening, or biliary dilatation. Pancreas: Unremarkable. No pancreatic ductal dilatation or surrounding inflammatory changes. Spleen: Normal in size without significant abnormality. Adrenals/Urinary Tract: Adrenal glands are unremarkable. Small nonobstructive right renal calculi. No left-sided calculi, ureteral calculi, or hydronephrosis. Bladder is unremarkable. Stomach/Bowel: Stomach is within normal limits. Appendix appears normal. No evidence of bowel wall thickening, distention,  or inflammatory changes. Descending and sigmoid diverticulosis. Vascular/Lymphatic: Aortic atherosclerosis. No enlarged abdominal or pelvic lymph nodes. Reproductive: No mass or other significant abnormality. Vasectomy clips. Other: Status post left inguinal hernia repair.  No ascites. Musculoskeletal: No acute or significant osseous findings. IMPRESSION: 1. Small nonobstructive right renal calculi. No left-sided calculi, ureteral calculi, or hydronephrosis. 2. Descending and sigmoid diverticulosis without evidence of acute diverticulitis. 3. Status post left inguinal hernia repair. 4. Coronary artery disease. Aortic Atherosclerosis (ICD10-I70.0). Electronically Signed   By: Delanna Ahmadi M.D.   On: 07/28/2022 14:30    Results are pending, will review when available.  Assessment and Plan: No notes have been filed under this hospital service. Service: Hospitalist Advance Care Planning:   Code Status: Full Code   Consults: None  Family Communication: Patient's wife updated at bedside  Severity of Illness: The appropriate patient status for this  patient is OBSERVATION. Observation status is judged to be reasonable and necessary in order to provide the required intensity of service to ensure the patient's safety. The patient's presenting symptoms, physical exam findings, and initial radiographic and laboratory data in the context of their medical condition is felt to place them at decreased risk for further clinical deterioration. Furthermore, it is anticipated that the patient will be medically stable for discharge from the hospital within 2 midnights of admission.   Author: Jose Persia, MD 07/28/2022 11:52 PM  For on call review [email or web address].

## 2022-07-28 NOTE — ED Notes (Signed)
Pt has urinal and is aware of need for sample

## 2022-07-28 NOTE — Sepsis Progress Note (Signed)
Elink monitoring for the code sepsis protocol.  

## 2022-07-28 NOTE — Consult Note (Signed)
PHARMACY -  BRIEF ANTIBIOTIC NOTE   Pharmacy has received consult(s) for vancomycin and cefepime from an ED provider.  The patient's profile has been reviewed for ht/wt/allergies/indication/available labs.    One time order(s) placed for: Vancomycin 1g IV x1 in ED Cefepime 2g IV x1 in ED Pt also receiving Metronidazole '500mg'$  IV x1 in ED  Further antibiotics/pharmacy consults should be ordered by admitting physician if indicated.                       Thank you, Lorna Dibble 07/28/2022  6:32 PM

## 2022-07-28 NOTE — ED Triage Notes (Signed)
Patient arrived by EMS from home c/o right flank pain with N/V X2 days.   EMS vitals: 047V systolic 987 HR  EMS administered 182mg fentanyl

## 2022-07-28 NOTE — ED Provider Notes (Signed)
[facility] Provider Note    Event Date/Time   First MD Initiated Contact with Patient 07/28/22 1252     (approximate)   History   Flank Pain   HPI  Keith Ramos is a 66 y.o. male who reports a history of kidney stones presents with complaints of left flank pain.  He reports it started yesterday and was severe all through the night.  He reports sharp pain radiating to his left groin.  Positive nausea and vomiting.  No fevers or chills.  No dysuria reported.  Has not take anything for this     Physical Exam   Triage Vital Signs: ED Triage Vitals  Enc Vitals Group     BP 07/28/22 1219 (!) 164/119     Pulse Rate 07/28/22 1219 (!) 113     Resp 07/28/22 1219 18     Temp 07/28/22 1219 98.4 F (36.9 C)     Temp Source 07/28/22 1219 Oral     SpO2 07/28/22 1219 99 %     Weight 07/28/22 1331 65.8 kg (145 lb 1 oz)     Height 07/28/22 1331 1.702 m ('5\' 7"'$ )     Head Circumference --      Peak Flow --      Pain Score 07/28/22 1224 9     Pain Loc --      Pain Edu? --      Excl. in [place]? --     Most recent vital signs: Vitals:   07/28/22 1219  BP: (!) 164/119  Pulse: (!) 113  Resp: 18  Temp: 98.4 F (36.9 C)  SpO2: 99%     General: Awake, uncomfortable CV:  Good peripheral perfusion.  Resp:  Normal effort.  Abd:  No distention.  No CVA tenderness, soft, nontender, reassuring exam Other:     ED Results / Procedures / Treatments   Labs (all labs ordered are listed, but only abnormal results are displayed) Labs Reviewed  COMPREHENSIVE METABOLIC PANEL - Abnormal; Notable for the following components:      Result Value   Glucose, Bld 147 (*)    Total Protein 8.4 (*)    All other components within normal limits  CBC - Abnormal; Notable for the following components:   WBC 11.9 (*)    All other components within normal limits  URINALYSIS, ROUTINE W REFLEX MICROSCOPIC     EKG  ED ECG REPORT I, Lavonia Drafts, the attending physician,  personally viewed and interpreted this ECG.  Date: 07/28/2022  Rhythm: Sinus tachycardia QRS Axis: normal Intervals: normal ST/T Wave abnormalities: Nonspecific Narrative Interpretation: no evidence of acute ischemia    RADIOLOGY CT renal stone study negative for acute ureterolithiasis    PROCEDURES:  Critical Care performed:   Procedures   MEDICATIONS ORDERED IN ED: Medications  ketorolac (TORADOL) 30 MG/ML injection 30 mg (30 mg Intravenous Given 07/28/22 1339)  ondansetron (ZOFRAN) injection 4 mg (4 mg Intravenous Given 07/28/22 1340)  0.9 %  sodium chloride infusion ( Intravenous New Bag/Given 07/28/22 1340)  morphine (PF) 4 MG/ML injection 4 mg (4 mg Intravenous Given 07/28/22 1439)  ondansetron (ZOFRAN) injection 4 mg (4 mg Intravenous Given 07/28/22 1439)     IMPRESSION / MDM / ASSESSMENT AND PLAN / ED COURSE  I reviewed the triage vital signs and the nursing notes. Patient's presentation is most consistent with acute presentation with potential threat to life or bodily function.  Patient presents with left-sided abdominal pain as detailed  above.  Suspicious for ureterolithiasis, diverticulitis is also on differential.  Doubt UTI  Pending urinalysis.  Lab work reviewed, mild elevation of white blood cell count which is nonspecific, otherwise reassuring lab work.  Treated with Toradol which did improve his pain however it did return  CT scan negative for acute ureterolithiasis, patient continues to have pain, will give a dose of IV morphine.  Pending urinalysis, have asked my colleague to follow-up on this, anticipate discharge        FINAL CLINICAL IMPRESSION(S) / ED DIAGNOSES   Final diagnoses:  Left flank pain     Rx / DC Orders   ED Discharge Orders     None        Note:  This document was prepared using Dragon voice recognition software and may include unintentional dictation errors.   Lavonia Drafts, MD 07/28/22 [phone]

## 2022-07-28 NOTE — ED Triage Notes (Signed)
Pt to ED via [facility] from home. Pt reports right flank pain with N/V x2 days. Pt reports hx kidney stones.

## 2022-07-28 NOTE — Consult Note (Signed)
CODE SEPSIS - PHARMACY COMMUNICATION  **Broad Spectrum Antibiotics should be administered within 1 hour of Sepsis diagnosis**  Time Code Sepsis Called/Page Received: 1831  Antibiotics Ordered: 1845  Time of 1st antibiotic administration: 1928  Additional action taken by pharmacy: Discussed with RN about med availability. All meds available, was some delay in attempt at 2nd set of labs. Going ahead with first doses now.  If necessary, Name of Provider/Nurse Contacted: Candace Berrier RN  Lorna Dibble ,PharmD Clinical Pharmacist  07/28/2022  6:33 PM

## 2022-07-28 NOTE — ED Provider Notes (Signed)
3:57 PM Assumed care for off going team.   Blood pressure (!) 164/119, pulse (!) 113, temperature 98.4 F (36.9 C), temperature source Oral, resp. rate 18, height '5\' 7"'$  (1.702 m), weight 65.8 kg, SpO2 99 %.  See their HPI for full report but in brief pending re-eval    Patient reports severe nausea and vomiting, abdominal distention not passing any gas.  Low-grade temperatures.  CT without contrast ordered by off going provider was reassuring but patient still having significant symptoms.  We will get a repeat CT with contrast to make sure no evidence of any abscess, infection, bowel obstruction, etc.  We will also get urine to evaluate for UTI and get COVID, flu testing and give some droperidol to try to help with symptoms.  Urine, CT, COVID, flu were all negative.  Patient respect a temperature of 100.8 and was tachycardic with lactate of 2.0.  Patient given some IV fluids.  Unclear source.  Patient has no chest wall tenderness or crepitus to suggest esophageal perforation.  Chest x-ray without any evidence of free air -unclear where this fever is coming from the patient to be bacteremic, septic.  We will discuss the hospital team for admission for further work-up and monitoring and was started on broad-spectrum antibiotics .Critical Care  Performed by: Vanessa [place], MD Authorized by: Vanessa [place], MD   Critical care provider statement:    Critical care time (minutes):  30   Critical care was necessary to treat or prevent imminent or life-threatening deterioration of the following conditions:  Sepsis   Critical care was time spent personally by me on the following activities:  Development of treatment plan with patient or surrogate, discussions with consultants, evaluation of patient's response to treatment, examination of patient, ordering and review of laboratory studies, ordering and review of radiographic studies, ordering and performing treatments and interventions, pulse oximetry,  re-evaluation of patient's condition and review of old charts       Vanessa Woodruff, MD 07/28/22 1859

## 2022-07-28 NOTE — H&P (Signed)
History and Physical    Patient: Keith Ramos DOB: 1956/01/01 DOA: 07/28/2022 DOS: the patient was seen and examined on 07/29/2022 PCP: Albina Billet, MD  Patient coming from: Home  Chief Complaint:  Chief Complaint  Patient presents with   Flank Pain   HPI: Keith Ramos is a 66 y.o. male with medical history significant of hypertension and prior kidney stones presents to the ED with complaints of nausea and vomiting.  Mr. Luviano states he was in his normal state of health over the last several days and visited his grandchildren.  He notes that one of his grandchildren had an upper respiratory infection of unknown etiology.  When returning home on 11/05, he began to develop right lower back pain that transitioned into groin pain.  He was concern for possible kidney stone as he has had these in the past and felt similar.  He subsequently began to experience nausea and vomiting that was nonbloody and nonmelanotic.  Vomiting persisted throughout the day and due to this, he sought care in the ED.  He endorses subjective fevers at home and chills but otherwise denies any shortness of breath, chest pain, palpitations, diarrhea, dysuria, hematuria.  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  He denies any recent dental work or joint injections.  ED course: On arrival to the ED, patient was febrile at 100.8 with heart rate of 114 and blood pressure 169/91.  He was saturating at 96% on room air.  Initial blood work demonstrated normal electrolytes and renal function.  White blood count elevated at 11.9.  Urinalysis notable for ketonuria only.CT renal study was obtained that demonstrated small nonobstructive right renal calculi.  CT abdomen/pelvis was subsequently obtained that demonstrated no evidence of intestinal obstruction or pneumoperitoneum.  No findings to explain patient's symptoms.  Chest x-ray was obtained that was  negative.  Due to concern for sepsis, [facility] contacted for admission.  Review of Systems: As mentioned in the history of present illness. All other systems reviewed and are negative. Past Medical History:  Diagnosis Date   Hypertension    Past Surgical History:  Procedure Laterality Date   HERNIA REPAIR  1977   66 yrs old multiple times since   KNEE ARTHROSCOPY Left 2007   Social History:  reports that he has quit smoking. He has never used smokeless tobacco. He reports that he does not use drugs. No history on file for alcohol use.  No Known Allergies  Family History  Problem Relation Age of Onset   Hepatitis B Father    Cirrhosis Father    Colon cancer Maternal Grandfather 51   Pancreatic cancer Maternal Grandmother    Colon cancer Cousin 38   Esophageal cancer Neg Hx    Stomach cancer Neg Hx     Prior to Admission medications   Medication Sig Start Date End Date Taking? Authorizing Provider  amLODipine (NORVASC) 5 MG tablet Take 5 mg by mouth daily. Patient not taking: Reported on 07/28/2022    [provider]  dimenhyDRINATE (DRAMAMINE PO) Take 1 tablet by mouth daily as needed (nausea). Patient not taking: Reported on 07/28/2022    [provider]  LIDOCAINE EX Apply 1 application topically daily as needed (pain). Patient not taking: Reported on 07/28/2022    [provider]  lisinopril (PRINIVIL,ZESTRIL) 20 MG tablet Take 20 mg by mouth 2 (two) times daily.  Patient not taking: Reported  on 07/28/2022    [provider]  omeprazole (PRILOSEC) 20 MG capsule Take 20 mg by mouth daily. Patient not taking: Reported on 07/28/2022    [provider]  traMADol (ULTRAM) 50 MG tablet Take 50-100 mg by mouth every 8 (eight) hours as needed for moderate pain.  Patient not taking: Reported on 07/28/2022 02/16/18   [provider]    Physical Exam: Vitals:   07/28/22 1331 07/28/22 1647 07/28/22 2044 07/28/22 2213  BP:  (!) 169/91  (!)  145/87  Pulse:  (!) 114  92  Resp:  16  18  Temp:  (!) 100.8 F (38.2 C) 99.8 F (37.7 C) 98.3 F (36.8 C)  TempSrc:  Oral Oral Oral  SpO2:  96%  95%  Weight: 65.8 kg     Height: '5\' 7"'$  (1.702 m)      Physical Exam Vitals and nursing note reviewed.  Constitutional:      General: He is not in acute distress.    Appearance: He is normal weight. He is not toxic-appearing.  HENT:     Head: Normocephalic and atraumatic.     Mouth/Throat:     Mouth: Mucous membranes are dry.     Pharynx: Oropharynx is clear.  Eyes:     Extraocular Movements: Extraocular movements intact.     Conjunctiva/sclera: Conjunctivae normal.     Pupils: Pupils are equal, round, and reactive to light.  Cardiovascular:     Rate and Rhythm: Regular rhythm. Tachycardia present.     Heart sounds: No murmur heard.    No gallop.  Pulmonary:     Effort: Pulmonary effort is normal. No respiratory distress.     Breath sounds: Normal breath sounds. No wheezing or rales.  Abdominal:     General: Bowel sounds are decreased.     Palpations: Abdomen is soft.     Tenderness: There is no abdominal tenderness. There is no right CVA tenderness, left CVA tenderness or guarding.     Hernia: No hernia is present.  Musculoskeletal:        General: No tenderness or signs of injury.     Right lower leg: No edema.     Left lower leg: No edema.  Skin:    General: Skin is warm and dry.     Comments: Minimal excoriations on bilateral upper extremities with no surrounding erythema.  Neurological:     General: No focal deficit present.     Mental Status: He is alert and oriented to person, place, and time. Mental status is at baseline.  Psychiatric:        Mood and Affect: Mood normal.        Behavior: Behavior normal.        Thought Content: Thought content normal.        Judgment: Judgment normal.    Data Reviewed: CBC notable for white blood count of 11.9, but normal hemoglobin and platelets.  CMP remarkable for glucose  of 147 and total protein of 8.4 but no other electrolyte abnormalities, renal dysfunction or hepatic dysfunction noted.  Troponin within normal limits.  Respiratory viral panel including flu and COVID-negative.  Lipase within normal limits.  Urinalysis with ketonuria but no leukocytes, WBC or RBC.  Lactic acid elevated at 2.0.  EKG personally reviewed.  Consistent with sinus rhythm with a rate of 110.  Nonspecific repolarization changes.  No acute ST or T wave changes to suggest acute ischemia.  DG Chest 2 View  Result Date:  07/28/2022 CLINICAL DATA:  Fever EXAM: CHEST - 2 VIEW COMPARISON:  03/22/2018 FINDINGS: The heart size and mediastinal contours are within normal limits. Both lungs are clear. Unchanged elevation of the right hemidiaphragm. The visualized skeletal structures are unremarkable. IMPRESSION: No acute abnormality of the lungs. Unchanged elevation of the right hemidiaphragm. Electronically Signed   By: Delanna Ahmadi M.D.   On: 07/28/2022 18:26   CT ABDOMEN PELVIS W CONTRAST  Result Date: 07/28/2022 CLINICAL DATA:  Left flank pain EXAM: CT ABDOMEN AND PELVIS WITH CONTRAST TECHNIQUE: Multidetector CT imaging of the abdomen and pelvis was performed using the standard protocol following bolus administration of intravenous contrast. RADIATION DOSE REDUCTION: This exam was performed according to the departmental dose-optimization program which includes automated exposure control, adjustment of the mA and/or kV according to patient size and/or use of iterative reconstruction technique. CONTRAST:  178m OMNIPAQUE IOHEXOL 300 MG/ML  SOLN COMPARISON:  Noncontrast study done earlier today FINDINGS: Lower chest: Visualized lower lung fields are essentially clear. Small linear densities in right middle lobe may suggest minimal scarring or minimal subsegmental atelectasis. Coronary artery calcifications are seen. Hepatobiliary: There is fatty infiltration in liver. There is no dilation of bile ducts.  Gallbladder is unremarkable. Pancreas: No focal abnormalities are seen. Spleen: Unremarkable. Adrenals/Urinary Tract: Adrenals are unremarkable. There is no hydronephrosis. There is 3 mm calculus in the upper pole of right kidney. Ureters are unremarkable. Urinary bladder is distended. There is no wall thickening in the bladder. Stomach/Bowel: Stomach is moderately distended with fluid. There is no significant small bowel dilation. Appendix is not dilated. Multiple diverticula are seen in colon. There is no evidence of focal acute diverticulitis. Vascular/Lymphatic: Scattered arterial calcifications are seen. Reproductive: Prostate is enlarged. Other: There is no ascites or pneumoperitoneum. Small umbilical hernia containing fat is seen. There is previous repair of left inguinal hernia. Musculoskeletal: There is slight decrease in height of upper endplate of body of [id number] along with Schmorl's node. This may be residual from previous injury. Degenerative changes are noted with encroachment of neural foramina in lower lumbar spine. IMPRESSION: There is no evidence of intestinal obstruction or pneumoperitoneum. Appendix is not dilated. There is no hydronephrosis. There is small right renal calculus. Fatty liver. Diverticulosis of colon without signs of focal diverticulitis. Lumbar spondylosis. Aortic atherosclerosis. Coronary artery disease. Decrease in height of body of T11 vertebra may be residual from previous injury. Other findings as described in the body of the report. Electronically Signed   By: PElmer PickerM.D.   On: 07/28/2022 16:38   CT Renal Stone Study  Result Date: 07/28/2022 CLINICAL DATA:  Right flank pain for 2 days, kidney stones suspected EXAM: CT ABDOMEN AND PELVIS WITHOUT CONTRAST TECHNIQUE: Multidetector CT imaging of the abdomen and pelvis was performed following the standard protocol without IV contrast. RADIATION DOSE REDUCTION: This exam was performed according to the  departmental dose-optimization program which includes automated exposure control, adjustment of the mA and/or kV according to patient size and/or use of iterative reconstruction technique. COMPARISON:  03/22/2018 FINDINGS: Lower chest: No acute abnormality. Three-vessel coronary artery calcifications Hepatobiliary: No solid liver abnormality is seen. No gallstones, gallbladder wall thickening, or biliary dilatation. Pancreas: Unremarkable. No pancreatic ductal dilatation or surrounding inflammatory changes. Spleen: Normal in size without significant abnormality. Adrenals/Urinary Tract: Adrenal glands are unremarkable. Small nonobstructive right renal calculi. No left-sided calculi, ureteral calculi, or hydronephrosis. Bladder is unremarkable. Stomach/Bowel: Stomach is within normal limits. Appendix appears normal. No evidence of bowel wall thickening, distention,  or inflammatory changes. Descending and sigmoid diverticulosis. Vascular/Lymphatic: Aortic atherosclerosis. No enlarged abdominal or pelvic lymph nodes. Reproductive: No mass or other significant abnormality. Vasectomy clips. Other: Status post left inguinal hernia repair.  No ascites. Musculoskeletal: No acute or significant osseous findings. IMPRESSION: 1. Small nonobstructive right renal calculi. No left-sided calculi, ureteral calculi, or hydronephrosis. 2. Descending and sigmoid diverticulosis without evidence of acute diverticulitis. 3. Status post left inguinal hernia repair. 4. Coronary artery disease. Aortic Atherosclerosis (ICD10-I70.0). Electronically Signed   By: Delanna Ahmadi M.D.   On: 07/28/2022 14:30    Results are pending, will review when available.  Assessment and Plan: * Nausea and vomiting Patient presenting with 24-hour history of nausea, vomiting and abdominal distention with no diarrhea.  Initially, symptoms began with right lower back pain and groin pain, but these rapidly resolved and are not present at this time.  Work-up  thus far has been essentially negative.  Initial concern for sepsis given fever, leukocytosis and tachycardia, however no evidence of organ dysfunction nor source of infection.  He was covered with cefepime, metronidazole and vancomycin.  Differential at this time is viral gastroenteritis, but we will still obtain blood cultures to rule out bacteremia (no current risk factors for bacteremia including recent dental work, surgery, injections, or high risk history).   - GI panel ordered - Blood cultures pending - Discontinue cefepime, metronidazole, vancomycin - Start azithromycin for gastroenteritis coverage - Tylenol as needed for fever - IV fluids  Sinus tachycardia Patient presenting with sinus tachycardia on admission, likely multifactorial in the setting of vomiting and subsequent dehydration.  Heart rate has improved at this time.  - Telemetry monitoring - IV fluids  HYPERTENSION, BENIGN ESSENTIAL, LABILE - Restart home antihypertensives  Advance Care Planning:   Code Status: Full Code   Consults: None  Family Communication: Patient's wife updated at bedside  Severity of Illness: The appropriate patient status for this patient is OBSERVATION. Observation status is judged to be reasonable and necessary in order to provide the required intensity of service to ensure the patient's safety. The patient's presenting symptoms, physical exam findings, and initial radiographic and laboratory data in the context of their medical condition is felt to place them at decreased risk for further clinical deterioration. Furthermore, it is anticipated that the patient will be medically stable for discharge from the hospital within 2 midnights of admission.   Author: Jose Persia, MD 07/29/2022 12:11 AM  For on call review [email or web address].

## 2022-07-28 NOTE — ED Notes (Signed)
Unable to obtain 2nd set of blood cultures.  Started anbx as to not further delay care.

## 2022-07-29 DIAGNOSIS — Z87442 Personal history of urinary calculi: Secondary | ICD-10-CM | POA: Diagnosis not present

## 2022-07-29 DIAGNOSIS — R112 Nausea with vomiting, unspecified: Secondary | ICD-10-CM | POA: Diagnosis present

## 2022-07-29 DIAGNOSIS — N400 Enlarged prostate without lower urinary tract symptoms: Secondary | ICD-10-CM | POA: Diagnosis present

## 2022-07-29 DIAGNOSIS — Z2089 Contact with and (suspected) exposure to other communicable diseases: Secondary | ICD-10-CM | POA: Diagnosis present

## 2022-07-29 DIAGNOSIS — Z800 Family history of malignant neoplasm of digestive organs: Secondary | ICD-10-CM | POA: Diagnosis not present

## 2022-07-29 DIAGNOSIS — E876 Hypokalemia: Secondary | ICD-10-CM

## 2022-07-29 DIAGNOSIS — Z79899 Other long term (current) drug therapy: Secondary | ICD-10-CM | POA: Diagnosis not present

## 2022-07-29 DIAGNOSIS — Z831 Family history of other infectious and parasitic diseases: Secondary | ICD-10-CM | POA: Diagnosis not present

## 2022-07-29 DIAGNOSIS — E860 Dehydration: Secondary | ICD-10-CM | POA: Diagnosis present

## 2022-07-29 DIAGNOSIS — R000 Tachycardia, unspecified: Secondary | ICD-10-CM

## 2022-07-29 DIAGNOSIS — N451 Epididymitis: Secondary | ICD-10-CM | POA: Diagnosis present

## 2022-07-29 DIAGNOSIS — R824 Acetonuria: Secondary | ICD-10-CM | POA: Diagnosis present

## 2022-07-29 DIAGNOSIS — Z20822 Contact with and (suspected) exposure to covid-19: Secondary | ICD-10-CM | POA: Diagnosis present

## 2022-07-29 DIAGNOSIS — N200 Calculus of kidney: Secondary | ICD-10-CM | POA: Diagnosis present

## 2022-07-29 DIAGNOSIS — R109 Unspecified abdominal pain: Secondary | ICD-10-CM | POA: Diagnosis present

## 2022-07-29 DIAGNOSIS — I10 Essential (primary) hypertension: Secondary | ICD-10-CM | POA: Diagnosis present

## 2022-07-29 DIAGNOSIS — Z87891 Personal history of nicotine dependence: Secondary | ICD-10-CM | POA: Diagnosis not present

## 2022-07-29 DIAGNOSIS — Z8379 Family history of other diseases of the digestive system: Secondary | ICD-10-CM | POA: Diagnosis not present

## 2022-07-29 LAB — CBC
HCT: 37.7 % — ABNORMAL LOW (ref 39.0–52.0)
Hemoglobin: 12.9 g/dL — ABNORMAL LOW (ref 13.0–17.0)
MCH: 30.4 pg (ref 26.0–34.0)
MCHC: 34.2 g/dL (ref 30.0–36.0)
MCV: 88.9 fL (ref 80.0–100.0)
Platelets: 206 10*3/uL (ref 150–400)
RBC: 4.24 MIL/uL (ref 4.22–5.81)
RDW: 12.9 % (ref 11.5–15.5)
WBC: 8.4 10*3/uL (ref 4.0–10.5)
nRBC: 0 % (ref 0.0–0.2)

## 2022-07-29 LAB — BASIC METABOLIC PANEL
Anion gap: 6 (ref 5–15)
BUN: 15 mg/dL (ref 8–23)
CO2: 24 mmol/L (ref 22–32)
Calcium: 8.5 mg/dL — ABNORMAL LOW (ref 8.9–10.3)
Chloride: 111 mmol/L (ref 98–111)
Creatinine, Ser: 0.73 mg/dL (ref 0.61–1.24)
GFR, Estimated: >60 mL/min (ref >60)
Glucose, Bld: 109 mg/dL — ABNORMAL HIGH (ref 70–99)
Potassium: 3.3 mmol/L — ABNORMAL LOW (ref 3.5–5.1)
Sodium: 141 mmol/L (ref 135–145)

## 2022-07-29 LAB — HIV ANTIBODY (ROUTINE TESTING W REFLEX): HIV Screen 4th Generation wRfx: NONREACTIVE

## 2022-07-29 MED ORDER — LACTATED RINGERS IV SOLN: INTRAVENOUS | Status: AC

## 2022-07-29 MED ORDER — OXYCODONE-ACETAMINOPHEN 5-325 MG PO TABS
1.0000 | ORAL_TABLET | Freq: Four times a day (QID) | ORAL | Status: DC | PRN
  Administered 2022-07-29 – 2022-07-31 (×7): 1 via ORAL
  Filled 2022-07-29 (×7): qty 1

## 2022-07-29 MED ORDER — TAMSULOSIN HCL 0.4 MG PO CAPS: 0.4000 mg | ORAL_CAPSULE | Freq: Every day | ORAL | Status: DC

## 2022-07-29 MED ORDER — POTASSIUM CHLORIDE CRYS ER 20 MEQ PO TBCR
40.0000 meq | EXTENDED_RELEASE_TABLET | Freq: Once | ORAL | Status: AC
  Administered 2022-07-29: 40 meq via ORAL
  Filled 2022-07-29: qty 2

## 2022-07-29 MED ORDER — FINASTERIDE 5 MG PO TABS
5.0000 mg | ORAL_TABLET | Freq: Every day | ORAL | Status: DC
  Administered 2022-07-29 – 2022-07-31 (×3): 5 mg via ORAL
  Filled 2022-07-29 (×3): qty 1

## 2022-07-29 MED ORDER — SODIUM CHLORIDE 0.9 % IV SOLN
500.0000 mg | INTRAVENOUS | Status: DC
  Administered 2022-07-29 (×2): 500 mg via INTRAVENOUS
  Filled 2022-07-29 (×2): qty 5

## 2022-07-29 MED ORDER — MORPHINE SULFATE (PF) 2 MG/ML IV SOLN
1.0000 mg | INTRAVENOUS | Status: DC | PRN
  Administered 2022-07-29 – 2022-07-31 (×6): 1 mg via INTRAVENOUS
  Filled 2022-07-29 (×6): qty 1

## 2022-07-29 NOTE — Assessment & Plan Note (Addendum)
Patient presenting with 24-hour history of nausea, vomiting and abdominal distention with no diarrhea.  Initially, symptoms began with right lower back pain and groin pain, but these rapidly resolved and are not present at this time.  Work-up thus far has been essentially negative.  Initial concern for sepsis given fever, leukocytosis and tachycardia, however no evidence of organ dysfunction nor source of infection.  He was covered with cefepime, metronidazole and vancomycin.  Differential at this time is viral gastroenteritis, but we will still obtain blood cultures to rule out bacteremia (no current risk factors for bacteremia including recent dental work, surgery, injections, or high risk history).   - GI panel ordered - Blood cultures pending - Discontinue cefepime, metronidazole, vancomycin - Start azithromycin for gastroenteritis coverage - Tylenol as needed for fever - IV fluids

## 2022-07-29 NOTE — Assessment & Plan Note (Signed)
Patient presenting with sinus tachycardia on admission, likely multifactorial in the setting of vomiting and subsequent dehydration.  Heart rate has improved at this time.  - Telemetry monitoring - IV fluids

## 2022-07-29 NOTE — Assessment & Plan Note (Signed)
-   Restart home antihypertensives

## 2022-07-29 NOTE — Progress Notes (Signed)
PROGRESS NOTE   HPI was taken from Dr. Charleen Kirks: Keith Ramos is a 66 y.o. male with medical history significant of hypertension and prior kidney stones presents to the ED with complaints of nausea and vomiting.   Keith Ramos states he was in his normal state of health over the last several days and visited his grandchildren.  He notes that one of his grandchildren had an upper respiratory infection of unknown etiology.  When returning home on 11/05, he began to develop right lower back pain that transitioned into groin pain.  He was concern for possible kidney stone as he has had these in the past and felt similar.  He subsequently began to experience nausea and vomiting that was nonbloody and nonmelanotic.  Vomiting persisted throughout the day and due to this, he sought care in the ED.  He endorses subjective fevers at home and chills but otherwise denies any shortness of breath, chest pain, palpitations, diarrhea, dysuria, hematuria.  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  He denies any recent dental work or joint injections.   ED course: On arrival to the ED, patient was febrile at 100.8 with heart rate of 114 and blood pressure 169/91.  He was saturating at 96% on room air.  Initial blood work demonstrated normal electrolytes and renal function.  White blood count elevated at 11.9.  Urinalysis notable for ketonuria only.CT renal study was obtained that demonstrated small nonobstructive right renal calculi.  CT abdomen/pelvis was subsequently obtained that demonstrated no evidence of intestinal obstruction or pneumoperitoneum.  No findings to explain patient's symptoms.  Chest x-ray was obtained that was negative.  Due to concern for sepsis, [facility] contacted for admission.  Keith Ramos  UXL:[id number] DOB: May 08, 1956 DOA: 07/28/2022 PCP: Albina Billet, MD   Assessment & Plan:   Principal Problem:   Nausea and vomiting Active  Problems:   Sinus tachycardia   HYPERTENSION, BENIGN ESSENTIAL, LABILE  Assessment and Plan: Nausea, vomiting: etiology unclear, possible gastroenteritis. GI PCR panel ordered by admitting physician. Continue on IV azithromycin (started by admitting physician). Zofran prn for nausea/vomiting. CT abd/pelvis for any acute intraabdominal acute findings   Enlarged prostate: as per CT scan, likely BPH. Started on finasteride. Pt denies any trouble urinating   Sepsis r/o: no source of infection found   HTN: continue on home dose of amlodipine, lisinopril  Hypokalemia: potassium given   DVT prophylaxis: lovenox Code Status: full  Family Communication:  Disposition Plan: likely d/c back home  Level of care: Telemetry Medical  Status is: Inpatient Remains inpatient appropriate because: severity of illness      Consultants:    Procedures:   Antimicrobials: azithromycin    Subjective: Pt c/o right groin pain   Objective: Vitals:   07/28/22 2213 07/29/22 0030 07/29/22 0537 07/29/22 0751  BP: (!) 145/87 (!) 152/91 (!) 163/82 (!) 147/86  Pulse: 92 87 98 82  Resp: '18 20 20 20  '$ Temp: 98.3 F (36.8 C) 97.9 F (36.6 C) 98.3 F (36.8 C) 98.2 F (36.8 C)  TempSrc: Oral Oral Oral Oral  SpO2: 95% 98% 100% 98%  Weight:      Height:        Intake/Output Summary (Last 24 hours) at 07/29/2022 0754 Last data filed at 07/29/2022 0303 Gross per 24 hour  Intake 693.53 ml  Output --  Net 693.53 ml   Filed Weights   07/28/22 1331  Weight:  65.8 kg    Examination:  General exam: Appears calm but  uncomfortable  Respiratory system: Clear to auscultation. Respiratory effort normal. Cardiovascular system: S1 & S2+. No rubs, gallops or clicks.  Gastrointestinal system: Abdomen is nondistended, soft and tenderness to palpation. Hypoactive bowel sounds heard. Central nervous system: Alert and oriented. Moves all extremities  Psychiatry: Judgement and insight appear normal. Flat mood  and affect    Data Reviewed: I have personally reviewed following labs and imaging studies  CBC: Recent Labs  Lab 07/28/22 1226 07/29/22 0207  WBC 11.9* 8.4  HGB 14.5 12.9*  HCT 41.1 37.7*  MCV 86.9 88.9  PLT 252 409   Basic Metabolic Panel: Recent Labs  Lab 07/28/22 1226 07/29/22 0207  NA 138 141  K 4.1 3.3*  CL 103 111  CO2 22 24  GLUCOSE 147* 109*  BUN 12 15  CREATININE 0.82 0.73  CALCIUM 9.8 8.5*   GFR: Estimated Creatinine Clearance: 84.5 mL/min (by C-G formula based on SCr of 0.73 mg/dL). Liver Function Tests: Recent Labs  Lab 07/28/22 1226  AST 31  ALT 30  ALKPHOS 66  BILITOT 0.7  PROT 8.4*  ALBUMIN 4.7   Recent Labs  Lab 07/28/22 1602  LIPASE 28   No results for input(s): "AMMONIA" in the last 168 hours. Coagulation Profile: No results for input(s): "INR", "PROTIME" in the last 168 hours. Cardiac Enzymes: No results for input(s): "CKTOTAL", "CKMB", "CKMBINDEX", "TROPONINI" in the last 168 hours. BNP (last 3 results) No results for input(s): "PROBNP" in the last 8760 hours. HbA1C: No results for input(s): "HGBA1C" in the last 72 hours. CBG: No results for input(s): "GLUCAP" in the last 168 hours. Lipid Profile: No results for input(s): "CHOL", "HDL", "LDLCALC", "TRIG", "CHOLHDL", "LDLDIRECT" in the last 72 hours. Thyroid Function Tests: No results for input(s): "TSH", "T4TOTAL", "FREET4", "T3FREE", "THYROIDAB" in the last 72 hours. Anemia Panel: No results for input(s): "VITAMINB12", "FOLATE", "FERRITIN", "TIBC", "IRON", "RETICCTPCT" in the last 72 hours. Sepsis Labs: Recent Labs  Lab 07/28/22 1803  LATICACIDVEN 2.0*    Recent Results (from the past 240 hour(s))  Resp Panel by RT-PCR (Flu A&B, Covid) Anterior Nasal Swab     Status: None   Collection Time: 07/28/22  3:56 PM   Specimen: Anterior Nasal Swab  Result Value Ref Range Status   SARS Coronavirus 2 by RT PCR NEGATIVE NEGATIVE Final    Comment: (NOTE) SARS-CoV-2 target  nucleic acids are NOT DETECTED.  The SARS-CoV-2 RNA is generally detectable in upper respiratory specimens during the acute phase of infection. The lowest concentration of SARS-CoV-2 viral copies this assay can detect is 138 copies/mL. A negative result does not preclude SARS-Cov-2 infection and should not be used as the sole basis for treatment or other patient management decisions. A negative result may occur with  improper specimen collection/handling, submission of specimen other than nasopharyngeal swab, presence of viral mutation(s) within the areas targeted by this assay, and inadequate number of viral copies(<138 copies/mL). A negative result must be combined with clinical observations, patient history, and epidemiological information. The expected result is Negative.  Fact Sheet for Patients:  EntrepreneurPulse.com.au  Fact Sheet for Healthcare Providers:  IncredibleEmployment.be  This test is no t yet approved or cleared by the [place] FDA and  has been authorized for detection and/or diagnosis of SARS-CoV-2 by FDA under an Emergency Use Authorization (EUA). This EUA will remain  in effect (meaning this test can be used) for the duration of the COVID-19 declaration  under Section 564(b)(1) of the Act, 21 U.S.C.section 360bbb-3(b)(1), unless the authorization is terminated  or revoked sooner.       Influenza A by PCR NEGATIVE NEGATIVE Final   Influenza B by PCR NEGATIVE NEGATIVE Final    Comment: (NOTE) The Xpert Xpress SARS-CoV-2/FLU/RSV plus assay is intended as an aid in the diagnosis of influenza from Nasopharyngeal swab specimens and should not be used as a sole basis for treatment. Nasal washings and aspirates are unacceptable for Xpert Xpress SARS-CoV-2/FLU/RSV testing.  Fact Sheet for Patients: EntrepreneurPulse.com.au  Fact Sheet for Healthcare  Providers: IncredibleEmployment.be  This test is not yet approved or cleared by the [place] FDA and has been authorized for detection and/or diagnosis of SARS-CoV-2 by FDA under an Emergency Use Authorization (EUA). This EUA will remain in effect (meaning this test can be used) for the duration of the COVID-19 declaration under Section 564(b)(1) of the Act, 21 U.S.C. section 360bbb-3(b)(1), unless the authorization is terminated or revoked.  Performed at [facility], [address] [place], [place] [address]   Blood culture (routine x 2)     Status: None (Preliminary result)   Collection Time: 07/28/22  6:03 PM   Specimen: BLOOD  Result Value Ref Range Status   Specimen Description BLOOD LEFT ANTECUBITAL  Final   Special Requests   Final    BOTTLES DRAWN AEROBIC AND ANAEROBIC Blood Culture results may not be optimal due to an excessive volume of blood received in culture bottles   Culture   Final    NO GROWTH < 12 HOURS Performed at [facility], [address], [place], [place] [address]    Report Status PENDING  Incomplete  Blood culture (routine x 2)     Status: None (Preliminary result)   Collection Time: 07/29/22  2:07 AM   Specimen: BLOOD  Result Value Ref Range Status   Specimen Description BLOOD LEFT WRIST  Final   Special Requests   Final    BOTTLES DRAWN AEROBIC AND ANAEROBIC Blood Culture results may not be optimal due to an excessive volume of blood received in culture bottles   Culture   Final    NO GROWTH < 12 HOURS Performed at [facility], [address], [place], [place] [address]    Report Status PENDING  Incomplete         Radiology Studies: DG Chest 2 View  Result Date: 07/28/2022 CLINICAL DATA:  Fever EXAM: CHEST - 2 VIEW COMPARISON:  03/22/2018 FINDINGS: The heart size and mediastinal contours are within normal limits. Both lungs are clear. Unchanged elevation of the right hemidiaphragm. The  visualized skeletal structures are unremarkable. IMPRESSION: No acute abnormality of the lungs. Unchanged elevation of the right hemidiaphragm. Electronically Signed   By: Delanna Ahmadi M.D.   On: 07/28/2022 18:26   CT ABDOMEN PELVIS W CONTRAST  Result Date: 07/28/2022 CLINICAL DATA:  Left flank pain EXAM: CT ABDOMEN AND PELVIS WITH CONTRAST TECHNIQUE: Multidetector CT imaging of the abdomen and pelvis was performed using the standard protocol following bolus administration of intravenous contrast. RADIATION DOSE REDUCTION: This exam was performed according to the departmental dose-optimization program which includes automated exposure control, adjustment of the mA and/or kV according to patient size and/or use of iterative reconstruction technique. CONTRAST:  138m OMNIPAQUE IOHEXOL 300 MG/ML  SOLN COMPARISON:  Noncontrast study done earlier today FINDINGS: Lower chest: Visualized lower lung fields are essentially clear. Small linear densities in right middle lobe may suggest minimal scarring  or minimal subsegmental atelectasis. Coronary artery calcifications are seen. Hepatobiliary: There is fatty infiltration in liver. There is no dilation of bile ducts. Gallbladder is unremarkable. Pancreas: No focal abnormalities are seen. Spleen: Unremarkable. Adrenals/Urinary Tract: Adrenals are unremarkable. There is no hydronephrosis. There is 3 mm calculus in the upper pole of right kidney. Ureters are unremarkable. Urinary bladder is distended. There is no wall thickening in the bladder. Stomach/Bowel: Stomach is moderately distended with fluid. There is no significant small bowel dilation. Appendix is not dilated. Multiple diverticula are seen in colon. There is no evidence of focal acute diverticulitis. Vascular/Lymphatic: Scattered arterial calcifications are seen. Reproductive: Prostate is enlarged. Other: There is no ascites or pneumoperitoneum. Small umbilical hernia containing fat is seen. There is previous  repair of left inguinal hernia. Musculoskeletal: There is slight decrease in height of upper endplate of body of X44 vertebra along with Schmorl's node. This may be residual from previous injury. Degenerative changes are noted with encroachment of neural foramina in lower lumbar spine. IMPRESSION: There is no evidence of intestinal obstruction or pneumoperitoneum. Appendix is not dilated. There is no hydronephrosis. There is small right renal calculus. Fatty liver. Diverticulosis of colon without signs of focal diverticulitis. Lumbar spondylosis. Aortic atherosclerosis. Coronary artery disease. Decrease in height of body of T11 vertebra may be residual from previous injury. Other findings as described in the body of the report. Electronically Signed   By: Elmer Picker M.D.   On: 07/28/2022 16:38   CT Renal Stone Study  Result Date: 07/28/2022 CLINICAL DATA:  Right flank pain for 2 days, kidney stones suspected EXAM: CT ABDOMEN AND PELVIS WITHOUT CONTRAST TECHNIQUE: Multidetector CT imaging of the abdomen and pelvis was performed following the standard protocol without IV contrast. RADIATION DOSE REDUCTION: This exam was performed according to the departmental dose-optimization program which includes automated exposure control, adjustment of the mA and/or kV according to patient size and/or use of iterative reconstruction technique. COMPARISON:  03/22/2018 FINDINGS: Lower chest: No acute abnormality. Three-vessel coronary artery calcifications Hepatobiliary: No solid liver abnormality is seen. No gallstones, gallbladder wall thickening, or biliary dilatation. Pancreas: Unremarkable. No pancreatic ductal dilatation or surrounding inflammatory changes. Spleen: Normal in size without significant abnormality. Adrenals/Urinary Tract: Adrenal glands are unremarkable. Small nonobstructive right renal calculi. No left-sided calculi, ureteral calculi, or hydronephrosis. Bladder is unremarkable. Stomach/Bowel:  Stomach is within normal limits. Appendix appears normal. No evidence of bowel wall thickening, distention, or inflammatory changes. Descending and sigmoid diverticulosis. Vascular/Lymphatic: Aortic atherosclerosis. No enlarged abdominal or pelvic lymph nodes. Reproductive: No mass or other significant abnormality. Vasectomy clips. Other: Status post left inguinal hernia repair.  No ascites. Musculoskeletal: No acute or significant osseous findings. IMPRESSION: 1. Small nonobstructive right renal calculi. No left-sided calculi, ureteral calculi, or hydronephrosis. 2. Descending and sigmoid diverticulosis without evidence of acute diverticulitis. 3. Status post left inguinal hernia repair. 4. Coronary artery disease. Aortic Atherosclerosis (ICD10-I70.0). Electronically Signed   By: Delanna Ahmadi M.D.   On: 07/28/2022 14:30        Scheduled Meds:  amLODipine  5 mg Oral Daily   enoxaparin (LOVENOX) injection  40 mg Subcutaneous Q24H   lisinopril  20 mg Oral BID   pantoprazole  40 mg Oral Daily   sodium chloride flush  3 mL Intravenous Q12H   Continuous Infusions:  azithromycin Stopped (07/29/22 0240)   lactated ringers 100 mL/hr at 07/29/22 0303     LOS: 0 days    Time spent: 35 mins  Wyvonnia Dusky, MD [facility] Pager [phone]  If 7PM-7AM, please contact night-coverage [email or web address] 07/29/2022, 7:54 AM

## 2022-07-30 DIAGNOSIS — N400 Enlarged prostate without lower urinary tract symptoms: Secondary | ICD-10-CM | POA: Insufficient documentation

## 2022-07-30 DIAGNOSIS — I10 Essential (primary) hypertension: Secondary | ICD-10-CM

## 2022-07-30 DIAGNOSIS — N451 Epididymitis: Secondary | ICD-10-CM

## 2022-07-30 HISTORY — DX: Epididymitis: N45.1

## 2022-07-30 LAB — CBC
HCT: 35.1 % — ABNORMAL LOW (ref 39.0–52.0)
Hemoglobin: 12.4 g/dL — ABNORMAL LOW (ref 13.0–17.0)
MCH: 31.2 pg (ref 26.0–34.0)
MCHC: 35.3 g/dL (ref 30.0–36.0)
MCV: 88.2 fL (ref 80.0–100.0)
Platelets: 188 10*3/uL (ref 150–400)
RBC: 3.98 MIL/uL — ABNORMAL LOW (ref 4.22–5.81)
RDW: 12.6 % (ref 11.5–15.5)
WBC: 6.2 10*3/uL (ref 4.0–10.5)
nRBC: 0 % (ref 0.0–0.2)

## 2022-07-30 LAB — BASIC METABOLIC PANEL
Anion gap: 6 (ref 5–15)
BUN: 16 mg/dL (ref 8–23)
CO2: 24 mmol/L (ref 22–32)
Calcium: 8.8 mg/dL — ABNORMAL LOW (ref 8.9–10.3)
Chloride: 109 mmol/L (ref 98–111)
Creatinine, Ser: 0.76 mg/dL (ref 0.61–1.24)
GFR, Estimated: >60 mL/min (ref >60)
Glucose, Bld: 99 mg/dL (ref 70–99)
Potassium: 3.7 mmol/L (ref 3.5–5.1)
Sodium: 139 mmol/L (ref 135–145)

## 2022-07-30 LAB — MAGNESIUM: Magnesium: 2.2 mg/dL (ref 1.7–2.4)

## 2022-07-30 MED ORDER — SODIUM CHLORIDE 0.9 % IV SOLN
2.0000 g | INTRAVENOUS | Status: DC
  Administered 2022-07-30 – 2022-07-31 (×2): 2 g via INTRAVENOUS
  Filled 2022-07-30: qty 2
  Filled 2022-07-30: qty 20

## 2022-07-30 NOTE — Hospital Course (Signed)
Keith Ramos is a 66 y.o. male with medical history significant of hypertension and prior kidney stones presents to the ED with complaints of nausea and vomiting.   Keith Ramos states he was in his normal state of health over the last several days and visited his grandchildren.  He notes that one of his grandchildren had an upper respiratory infection of unknown etiology.  When returning home on 11/05, he began to develop right lower back pain that transitioned into groin pain.  He was concern for possible kidney stone as he has had these in the past and felt similar.  He subsequently began to experience nausea and vomiting that was nonbloody and nonmelanotic.  Vomiting persisted throughout the day and due to this, he sought care in the ED.  He endorses subjective fevers at home and chills but otherwise denies any shortness of breath, chest pain, palpitations, diarrhea, dysuria, hematuria.  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  He denies any recent dental work or joint injections.  07/30/2022.  Patient complains of pain in his right groin and testicle.  Diagnosed with epididymitis and antibiotics changed over to Rocephin.  07/31/2022.  Patient feeling better and wanted to go home.  Still having some pain in his right testicle but thinks he will do better at home.  Patient given a dose of IV Rocephin prior to going home and Augmentin for 5 more days upon discharge.

## 2022-07-30 NOTE — Assessment & Plan Note (Signed)
Started on finasteride

## 2022-07-30 NOTE — Progress Notes (Signed)
Progress Note   Patient: Keith Ramos GUR:[id number] DOB: [birth year] DOA: 07/28/2022     1 DOS: the patient was seen and examined on 07/30/2022   Brief hospital course: Keith Ramos is a 66 y.o. male with medical history significant of hypertension and prior kidney stones presents to the ED with complaints of nausea and vomiting.   [name] states he was in his normal state of health over the last several days and visited his grandchildren.  He notes that one of his grandchildren had an upper respiratory infection of unknown etiology.  When returning home on 11/05, he began to develop right lower back pain that transitioned into groin pain.  He was concern for possible kidney stone as he has had these in the past and felt similar.  He subsequently began to experience nausea and vomiting that was nonbloody and nonmelanotic.  Vomiting persisted throughout the day and due to this, he sought care in the ED.  He endorses subjective fevers at home and chills but otherwise denies any shortness of breath, chest pain, palpitations, diarrhea, dysuria, hematuria.  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  He denies any recent dental work or joint injections.  07/30/2022.  Patient complains of pain in his right groin and testicle.  Diagnosed with epididymitis and antibiotics changed over to Rocephin.  Assessment and Plan: * Epididymitis, right Change antibiotics over to Rocephin.  This will cover the most common etiology of E. Coli in his age group.  Nausea and vomiting Improved  Sinus tachycardia Improved  BPH (benign prostatic hyperplasia) Started on finasteride  Essential hypertension Continue Norvasc and lisinopril        Subjective: Patient came in with nausea vomiting.  Complains of right testicular pain and right groin pain.  Also has a little pain in his back.  He thought he initially had a kidney stone.  Still not feeling  well.  Physical Exam: Vitals:   07/29/22 2316 07/30/22 0454 07/30/22 0836 07/30/22 0959  BP: 135/74 (!) 140/84 (!) 144/86 (!) 166/92  Pulse: 64 72 67   Resp: '18 20 16   '$ Temp: 98.4 F (36.9 C) 97.8 F (36.6 C) 98.2 F (36.8 C)   TempSrc: Oral Oral Oral   SpO2: 96% 98% 98%   Weight:      Height:       Physical Exam HENT:     Head: Normocephalic.     Mouth/Throat:     Pharynx: No oropharyngeal exudate.  Eyes:     General: Lids are normal.     Conjunctiva/sclera: Conjunctivae normal.  Cardiovascular:     Rate and Rhythm: Normal rate and regular rhythm.     Heart sounds: Normal heart sounds, S1 normal and S2 normal.  Pulmonary:     Breath sounds: No decreased breath sounds, wheezing, rhonchi or rales.  Abdominal:     Palpations: Abdomen is soft.     Tenderness: There is abdominal tenderness.  Genitourinary:    Testes:        Right: Tenderness not present.     Epididymis:     Right: Tenderness present.  Musculoskeletal:     Right lower leg: No swelling.     Left lower leg: No swelling.  Skin:    General: Skin is warm.     Findings: No rash.  Neurological:     Mental Status: He is alert and oriented to person, place, and  time.     Data Reviewed: Potassium 3.7, creatinine 0.76, magnesium 2.2, white blood cell count 6.2, hemoglobin 12.4   Disposition: Status is: Inpatient Remains inpatient appropriate because: We are changing treatment plan.  I am treating epididymitis.  Antibiotics changed over to Rocephin which would cover E. coli.  Planned Discharge Destination: Home    Time spent: 28 minutes  Author: Loletha Grayer, MD 07/30/2022 2:53 PM  For on call review [email or web address].

## 2022-07-30 NOTE — Progress Notes (Signed)
Pt resting very briefly throughout the night. Given prn pain medications with little effects noted. Reports constant pain to right groin "feels like someone is kicking me". Pt is requesting alternative med to address pain due to minimal effectiveness. Also would like to speak with medical team about further investigating source of his pain. Secure chat sent to attending Dr. Leslye Peer for further followup.

## 2022-07-30 NOTE — Assessment & Plan Note (Signed)
Changed antibiotics over to Rocephin on 11/8.  This will cover the most common etiology of E. Coli in his age group.  Patient given IV Rocephin on 11/9.  Switch over to p.o. Augmentin for 5 more days starting tomorrow.

## 2022-07-30 NOTE — Assessment & Plan Note (Signed)
Continue Norvasc and lisinopril

## 2022-07-31 MED ORDER — ACETAMINOPHEN 325 MG PO TABS: 650.0000 mg | ORAL_TABLET | Freq: Four times a day (QID) | ORAL | Status: DC | PRN

## 2022-07-31 MED ORDER — OXYCODONE-ACETAMINOPHEN 5-325 MG PO TABS: 1.0000 | ORAL_TABLET | Freq: Four times a day (QID) | ORAL | 0 refills | Status: DC | PRN

## 2022-07-31 MED ORDER — POLYETHYLENE GLYCOL 3350 17 G PO PACK: 17.0000 g | PACK | Freq: Every day | ORAL | 0 refills | Status: DC | PRN

## 2022-07-31 MED ORDER — FINASTERIDE 5 MG PO TABS: 5.0000 mg | ORAL_TABLET | Freq: Every day | ORAL | 0 refills | Status: DC

## 2022-07-31 MED ORDER — AMOXICILLIN-POT CLAVULANATE 875-125 MG PO TABS: 1.0000 | ORAL_TABLET | Freq: Two times a day (BID) | ORAL | 0 refills | Status: AC

## 2022-07-31 NOTE — Discharge Summary (Signed)
Physician Discharge Summary   Patient: Keith Ramos MRN: [id number] DOB: 03-Aug-1956  Admit date:     07/28/2022  Discharge date: 07/31/22  Discharge Physician: Loletha Grayer   PCP: Albina Billet, MD   Recommendations at discharge:   Follow-up PCP 5 days  Discharge Diagnoses: Principal Problem:   Epididymitis, right Active Problems:   Nausea and vomiting   Sinus tachycardia   Intractable nausea and vomiting   Essential hypertension   BPH (benign prostatic hyperplasia)   Hospital Course: Keith Ramos is a 66 y.o. male with medical history significant of hypertension and prior kidney stones presents to the ED with complaints of nausea and vomiting.   Keith Ramos states he was in his normal state of health over the last several days and visited his grandchildren.  He notes that one of his grandchildren had an upper respiratory infection of unknown etiology.  When returning home on 11/05, he began to develop right lower back pain that transitioned into groin pain.  He was concern for possible kidney stone as he has had these in the past and felt similar.  He subsequently began to experience nausea and vomiting that was nonbloody and nonmelanotic.  Vomiting persisted throughout the day and due to this, he sought care in the ED.  He endorses subjective fevers at home and chills but otherwise denies any shortness of breath, chest pain, palpitations, diarrhea, dysuria, hematuria.  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  He denies any recent dental work or joint injections.  07/30/2022.  Patient complains of pain in his right groin and testicle.  Diagnosed with epididymitis and antibiotics changed over to Rocephin.  07/31/2022.  Patient feeling better and wanted to go home.  Still having some pain in his right testicle but thinks he will do better at home.  Patient given a dose of IV Rocephin prior to going home and Augmentin for 5  more days upon discharge.  Assessment and Plan: * Epididymitis, right Changed antibiotics over to Rocephin on 11/8.  This will cover the most common etiology of E. Coli in his age group.  Patient given IV Rocephin on 11/9.  Switch over to p.o. Augmentin for 5 more days starting tomorrow.  Nausea and vomiting Improved  Sinus tachycardia Improved  BPH (benign prostatic hyperplasia) Started on finasteride  Essential hypertension Continue Norvasc and lisinopril         Consultants: None Procedures performed: None Disposition: Home Diet recommendation:  Cardiac diet DISCHARGE MEDICATION: Allergies as of 07/31/2022   No Known Allergies      Medication List     TAKE these medications    acetaminophen 325 MG tablet Commonly known as: TYLENOL Take 2 tablets (650 mg total) by mouth every 6 (six) hours as needed for mild pain (or Fever >/= 101).   amLODipine 5 MG tablet Commonly known as: NORVASC Take 5 mg by mouth daily.   amoxicillin-clavulanate 875-125 MG tablet Commonly known as: AUGMENTIN Take 1 tablet by mouth 2 (two) times daily for 5 days. Start taking on: August 01, 2022   finasteride 5 MG tablet Commonly known as: PROSCAR Take 1 tablet (5 mg total) by mouth daily. Start taking on: August 01, 2022   lisinopril 20 MG tablet Commonly known as: ZESTRIL Take 20 mg by mouth 2 (two) times daily.   oxyCODONE-acetaminophen 5-325 MG tablet Commonly known as: PERCOCET/ROXICET Take 1 tablet by mouth every 6 (  six) hours as needed for moderate pain.   polyethylene glycol 17 g packet Commonly known as: MIRALAX / GLYCOLAX Take 17 g by mouth daily as needed for mild constipation.        Follow-up Information     Albina Billet, MD. Go in 5 day(s).   Specialty: Internal Medicine Why: 08/06/2022 at 9:15 AM Contact information: [place] [address] [place]  [id number] [phone]                Discharge Exam: Danley Danker Weights   07/28/22  1331  Weight: 65.8 kg   Physical Exam HENT:     Head: Normocephalic.     Mouth/Throat:     Pharynx: No oropharyngeal exudate.  Eyes:     General: Lids are normal.     Conjunctiva/sclera: Conjunctivae normal.  Cardiovascular:     Rate and Rhythm: Normal rate and regular rhythm.     Heart sounds: Normal heart sounds, S1 normal and S2 normal.  Pulmonary:     Breath sounds: No decreased breath sounds, wheezing, rhonchi or rales.  Abdominal:     Palpations: Abdomen is soft.     Tenderness: There is abdominal tenderness.  Genitourinary:    Testes:        Right: Tenderness not present.     Epididymis:     Right: Tenderness present.  Musculoskeletal:     Right lower leg: No swelling.     Left lower leg: No swelling.  Skin:    General: Skin is warm.     Findings: No rash.  Neurological:     Mental Status: He is alert and oriented to person, place, and time.      Condition at discharge: stable  The results of significant diagnostics from this hospitalization (including imaging, microbiology, ancillary and laboratory) are listed below for reference.   Imaging Studies: DG Chest 2 View  Result Date: 07/28/2022 CLINICAL DATA:  Fever EXAM: CHEST - 2 VIEW COMPARISON:  03/22/2018 FINDINGS: The heart size and mediastinal contours are within normal limits. Both lungs are clear. Unchanged elevation of the right hemidiaphragm. The visualized skeletal structures are unremarkable. IMPRESSION: No acute abnormality of the lungs. Unchanged elevation of the right hemidiaphragm. Electronically Signed   By: Delanna Ahmadi M.D.   On: 07/28/2022 18:26   CT ABDOMEN PELVIS W CONTRAST  Result Date: 07/28/2022 CLINICAL DATA:  Left flank pain EXAM: CT ABDOMEN AND PELVIS WITH CONTRAST TECHNIQUE: Multidetector CT imaging of the abdomen and pelvis was performed using the standard protocol following bolus administration of intravenous contrast. RADIATION DOSE REDUCTION: This exam was performed according to the  departmental dose-optimization program which includes automated exposure control, adjustment of the mA and/or kV according to patient size and/or use of iterative reconstruction technique. CONTRAST:  120m OMNIPAQUE IOHEXOL 300 MG/ML  SOLN COMPARISON:  Noncontrast study done earlier today FINDINGS: Lower chest: Visualized lower lung fields are essentially clear. Small linear densities in right middle lobe may suggest minimal scarring or minimal subsegmental atelectasis. Coronary artery calcifications are seen. Hepatobiliary: There is fatty infiltration in liver. There is no dilation of bile ducts. Gallbladder is unremarkable. Pancreas: No focal abnormalities are seen. Spleen: Unremarkable. Adrenals/Urinary Tract: Adrenals are unremarkable. There is no hydronephrosis. There is 3 mm calculus in the upper pole of right kidney. Ureters are unremarkable. Urinary bladder is distended. There is no wall thickening in the bladder. Stomach/Bowel: Stomach is moderately distended with fluid. There is no significant small bowel dilation. Appendix is  not dilated. Multiple diverticula are seen in colon. There is no evidence of focal acute diverticulitis. Vascular/Lymphatic: Scattered arterial calcifications are seen. Reproductive: Prostate is enlarged. Other: There is no ascites or pneumoperitoneum. Small umbilical hernia containing fat is seen. There is previous repair of left inguinal hernia. Musculoskeletal: There is slight decrease in height of upper endplate of body of C14 vertebra along with Schmorl's node. This may be residual from previous injury. Degenerative changes are noted with encroachment of neural foramina in lower lumbar spine. IMPRESSION: There is no evidence of intestinal obstruction or pneumoperitoneum. Appendix is not dilated. There is no hydronephrosis. There is small right renal calculus. Fatty liver. Diverticulosis of colon without signs of focal diverticulitis. Lumbar spondylosis. Aortic atherosclerosis.  Coronary artery disease. Decrease in height of body of T11 vertebra may be residual from previous injury. Other findings as described in the body of the report. Electronically Signed   By: Elmer Picker M.D.   On: 07/28/2022 16:38   CT Renal Stone Study  Result Date: 07/28/2022 CLINICAL DATA:  Right flank pain for 2 days, kidney stones suspected EXAM: CT ABDOMEN AND PELVIS WITHOUT CONTRAST TECHNIQUE: Multidetector CT imaging of the abdomen and pelvis was performed following the standard protocol without IV contrast. RADIATION DOSE REDUCTION: This exam was performed according to the departmental dose-optimization program which includes automated exposure control, adjustment of the mA and/or kV according to patient size and/or use of iterative reconstruction technique. COMPARISON:  03/22/2018 FINDINGS: Lower chest: No acute abnormality. Three-vessel coronary artery calcifications Hepatobiliary: No solid liver abnormality is seen. No gallstones, gallbladder wall thickening, or biliary dilatation. Pancreas: Unremarkable. No pancreatic ductal dilatation or surrounding inflammatory changes. Spleen: Normal in size without significant abnormality. Adrenals/Urinary Tract: Adrenal glands are unremarkable. Small nonobstructive right renal calculi. No left-sided calculi, ureteral calculi, or hydronephrosis. Bladder is unremarkable. Stomach/Bowel: Stomach is within normal limits. Appendix appears normal. No evidence of bowel wall thickening, distention, or inflammatory changes. Descending and sigmoid diverticulosis. Vascular/Lymphatic: Aortic atherosclerosis. No enlarged abdominal or pelvic lymph nodes. Reproductive: No mass or other significant abnormality. Vasectomy clips. Other: Status post left inguinal hernia repair.  No ascites. Musculoskeletal: No acute or significant osseous findings. IMPRESSION: 1. Small nonobstructive right renal calculi. No left-sided calculi, ureteral calculi, or hydronephrosis. 2.  Descending and sigmoid diverticulosis without evidence of acute diverticulitis. 3. Status post left inguinal hernia repair. 4. Coronary artery disease. Aortic Atherosclerosis (ICD10-I70.0). Electronically Signed   By: Delanna Ahmadi M.D.   On: 07/28/2022 14:30    Microbiology: Results for orders placed or performed during the hospital encounter of 07/28/22  Resp Panel by RT-PCR (Flu A&B, Covid) Anterior Nasal Swab     Status: None   Collection Time: 07/28/22  3:56 PM   Specimen: Anterior Nasal Swab  Result Value Ref Range Status   SARS Coronavirus 2 by RT PCR NEGATIVE NEGATIVE Final    Comment: (NOTE) SARS-CoV-2 target nucleic acids are NOT DETECTED.  The SARS-CoV-2 RNA is generally detectable in upper respiratory specimens during the acute phase of infection. The lowest concentration of SARS-CoV-2 viral copies this assay can detect is 138 copies/mL. A negative result does not preclude SARS-Cov-2 infection and should not be used as the sole basis for treatment or other patient management decisions. A negative result may occur with  improper specimen collection/handling, submission of specimen other than nasopharyngeal swab, presence of viral mutation(s) within the areas targeted by this assay, and inadequate number of viral copies(<138 copies/mL). A negative result must be combined with clinical  observations, patient history, and epidemiological information. The expected result is Negative.  Fact Sheet for Patients:  EntrepreneurPulse.com.au  Fact Sheet for Healthcare Providers:  IncredibleEmployment.be  This test is no t yet approved or cleared by the [place] FDA and  has been authorized for detection and/or diagnosis of SARS-CoV-2 by FDA under an Emergency Use Authorization (EUA). This EUA will remain  in effect (meaning this test can be used) for the duration of the COVID-19 declaration under Section 564(b)(1) of the Act, 21 U.S.C.section  360bbb-3(b)(1), unless the authorization is terminated  or revoked sooner.       Influenza A by PCR NEGATIVE NEGATIVE Final   Influenza B by PCR NEGATIVE NEGATIVE Final    Comment: (NOTE) The Xpert Xpress SARS-CoV-2/FLU/RSV plus assay is intended as an aid in the diagnosis of influenza from Nasopharyngeal swab specimens and should not be used as a sole basis for treatment. Nasal washings and aspirates are unacceptable for Xpert Xpress SARS-CoV-2/FLU/RSV testing.  Fact Sheet for Patients: EntrepreneurPulse.com.au  Fact Sheet for Healthcare Providers: IncredibleEmployment.be  This test is not yet approved or cleared by the [place] FDA and has been authorized for detection and/or diagnosis of SARS-CoV-2 by FDA under an Emergency Use Authorization (EUA). This EUA will remain in effect (meaning this test can be used) for the duration of the COVID-19 declaration under Section 564(b)(1) of the Act, 21 U.S.C. section 360bbb-3(b)(1), unless the authorization is terminated or revoked.  Performed at [facility], [address] [place], [place] [address]   Blood culture (routine x 2)     Status: None (Preliminary result)   Collection Time: 07/28/22  6:03 PM   Specimen: BLOOD  Result Value Ref Range Status   Specimen Description BLOOD LEFT ANTECUBITAL  Final   Special Requests   Final    BOTTLES DRAWN AEROBIC AND ANAEROBIC Blood Culture results may not be optimal due to an excessive volume of blood received in culture bottles   Culture   Final    NO GROWTH 3 DAYS Performed at [facility], [address] [place], [place] [address]    Report Status PENDING  Incomplete  Blood culture (routine x 2)     Status: None (Preliminary result)   Collection Time: 07/29/22  2:07 AM   Specimen: BLOOD  Result Value Ref Range Status   Specimen Description BLOOD LEFT WRIST  Final   Special Requests   Final    BOTTLES DRAWN AEROBIC AND  ANAEROBIC Blood Culture results may not be optimal due to an excessive volume of blood received in culture bottles   Culture   Final    NO GROWTH 2 DAYS Performed at [facility], [address] [place], [place] [address]    Report Status PENDING  Incomplete    Labs: CBC: Recent Labs  Lab 07/28/22 1226 07/29/22 0207 07/30/22 0423  WBC 11.9* 8.4 6.2  HGB 14.5 12.9* 12.4*  HCT 41.1 37.7* 35.1*  MCV 86.9 88.9 88.2  PLT 252 206 818   Basic Metabolic Panel: Recent Labs  Lab 07/28/22 1226 07/29/22 0207 07/30/22 0423  NA 138 141 139  K 4.1 3.3* 3.7  CL 103 111 109  CO2 '22 24 24  '$ GLUCOSE 147* 109* 99  BUN '12 15 16  '$ CREATININE 0.82 0.73 0.76  CALCIUM 9.8 8.5* 8.8*  MG  --   --  2.2   Liver Function Tests: Recent Labs  Lab 07/28/22 1226  AST 31  ALT 30  ALKPHOS 66  BILITOT 0.7  PROT 8.4*  ALBUMIN 4.7    Discharge time spent: greater than 30 minutes.  Signed: Loletha Grayer, MD [facility] 07/31/2022

## 2022-07-31 NOTE — Progress Notes (Signed)
Discharge instructions were reviewed with pt. IV was taken out. Questions were answered. Pt denies pain at this time. Scheduled medications were given. Belongings were collected by pt.

## 2022-07-31 NOTE — TOC CM/SW Note (Signed)
  Transition of Care [facility]) Screening Note   Patient Details  Name: Keith Ramos Date of Birth: 1955/10/16   Transition [facility]) CM/SW Contact:    Candie Chroman, LCSW Phone Number: 07/31/2022, 8:19 AM    Transition of Care Department [facility]) has reviewed patient and no TOC needs have been identified at this time. We will continue to monitor patient advancement through interdisciplinary progression rounds. If new patient transition needs arise, please place a TOC consult.

## 2022-08-02 LAB — CULTURE, BLOOD (ROUTINE X 2): Culture: NO GROWTH

## 2022-08-03 LAB — CULTURE, BLOOD (ROUTINE X 2): Culture: NO GROWTH

## 2023-06-15 ENCOUNTER — Emergency Department: Payer: PPO

## 2023-06-15 ENCOUNTER — Other Ambulatory Visit: Payer: Self-pay

## 2023-06-15 ENCOUNTER — Inpatient Hospital Stay
Admission: EM | Admit: 2023-06-15 | Discharge: 2023-06-18 | DRG: 392 | Disposition: A | Discharge status: Home / Self Care | Payer: PPO | Attending: Internal Medicine | Admitting: Internal Medicine

## 2023-06-15 DIAGNOSIS — Z79899 Other long term (current) drug therapy: Secondary | ICD-10-CM

## 2023-06-15 DIAGNOSIS — N400 Enlarged prostate without lower urinary tract symptoms: Secondary | ICD-10-CM | POA: Diagnosis present

## 2023-06-15 DIAGNOSIS — Z800 Family history of malignant neoplasm of digestive organs: Secondary | ICD-10-CM

## 2023-06-15 DIAGNOSIS — I10 Essential (primary) hypertension: Secondary | ICD-10-CM | POA: Diagnosis present

## 2023-06-15 DIAGNOSIS — N433 Hydrocele, unspecified: Secondary | ICD-10-CM | POA: Diagnosis present

## 2023-06-15 DIAGNOSIS — E860 Dehydration: Secondary | ICD-10-CM | POA: Diagnosis present

## 2023-06-15 DIAGNOSIS — Z8719 Personal history of other diseases of the digestive system: Secondary | ICD-10-CM

## 2023-06-15 DIAGNOSIS — J690 Pneumonitis due to inhalation of food and vomit: Secondary | ICD-10-CM | POA: Diagnosis present

## 2023-06-15 DIAGNOSIS — R112 Nausea with vomiting, unspecified: Principal | ICD-10-CM | POA: Diagnosis present

## 2023-06-15 DIAGNOSIS — K760 Fatty (change of) liver, not elsewhere classified: Secondary | ICD-10-CM | POA: Diagnosis present

## 2023-06-15 DIAGNOSIS — I700 Atherosclerosis of aorta: Secondary | ICD-10-CM | POA: Diagnosis present

## 2023-06-15 DIAGNOSIS — E785 Hyperlipidemia, unspecified: Secondary | ICD-10-CM | POA: Diagnosis present

## 2023-06-15 DIAGNOSIS — I2510 Atherosclerotic heart disease of native coronary artery without angina pectoris: Secondary | ICD-10-CM | POA: Diagnosis present

## 2023-06-15 DIAGNOSIS — R109 Unspecified abdominal pain: Secondary | ICD-10-CM | POA: Diagnosis present

## 2023-06-15 DIAGNOSIS — R1084 Generalized abdominal pain: Secondary | ICD-10-CM

## 2023-06-15 DIAGNOSIS — Z87891 Personal history of nicotine dependence: Secondary | ICD-10-CM

## 2023-06-15 DIAGNOSIS — K529 Noninfective gastroenteritis and colitis, unspecified: Secondary | ICD-10-CM | POA: Diagnosis not present

## 2023-06-15 DIAGNOSIS — K219 Gastro-esophageal reflux disease without esophagitis: Secondary | ICD-10-CM | POA: Diagnosis present

## 2023-06-15 DIAGNOSIS — K449 Diaphragmatic hernia without obstruction or gangrene: Secondary | ICD-10-CM | POA: Diagnosis present

## 2023-06-15 DIAGNOSIS — K5730 Diverticulosis of large intestine without perforation or abscess without bleeding: Secondary | ICD-10-CM | POA: Diagnosis present

## 2023-06-15 DIAGNOSIS — N200 Calculus of kidney: Secondary | ICD-10-CM | POA: Diagnosis present

## 2023-06-15 DIAGNOSIS — Z1152 Encounter for screening for COVID-19: Secondary | ICD-10-CM

## 2023-06-15 DIAGNOSIS — R6510 Systemic inflammatory response syndrome (SIRS) of non-infectious origin without acute organ dysfunction: Secondary | ICD-10-CM | POA: Diagnosis present

## 2023-06-15 DIAGNOSIS — R197 Diarrhea, unspecified: Principal | ICD-10-CM

## 2023-06-15 LAB — CBC WITH DIFFERENTIAL/PLATELET
Abs Immature Granulocytes: 0.08 10*3/uL — ABNORMAL HIGH (ref 0.00–0.07)
Basophils Absolute: 0 10*3/uL (ref 0.0–0.1)
Basophils Relative: 0 %
Eosinophils Absolute: 0 10*3/uL (ref 0.0–0.5)
Eosinophils Relative: 0 %
HCT: 39.9 % (ref 39.0–52.0)
Hemoglobin: 14.3 g/dL (ref 13.0–17.0)
Immature Granulocytes: 1 %
Lymphocytes Relative: 3 %
Lymphs Abs: 0.5 10*3/uL — ABNORMAL LOW (ref 0.7–4.0)
MCH: 31.3 pg (ref 26.0–34.0)
MCHC: 35.8 g/dL (ref 30.0–36.0)
MCV: 87.3 fL (ref 80.0–100.0)
Monocytes Absolute: 0.5 10*3/uL (ref 0.1–1.0)
Monocytes Relative: 3 %
Neutro Abs: 15.7 10*3/uL — ABNORMAL HIGH (ref 1.7–7.7)
Neutrophils Relative %: 93 %
Platelets: 222 10*3/uL (ref 150–400)
RBC: 4.57 MIL/uL (ref 4.22–5.81)
RDW: 12.7 % (ref 11.5–15.5)
WBC: 16.9 10*3/uL — ABNORMAL HIGH (ref 4.0–10.5)
nRBC: 0 % (ref 0.0–0.2)

## 2023-06-15 LAB — URINALYSIS, ROUTINE W REFLEX MICROSCOPIC
Bilirubin Urine: NEGATIVE
Glucose, UA: 150 mg/dL — AB
Hgb urine dipstick: NEGATIVE
Ketones, ur: 5 mg/dL — AB
Leukocytes,Ua: NEGATIVE
Nitrite: NEGATIVE
Protein, ur: NEGATIVE mg/dL
Specific Gravity, Urine: 1.036 — ABNORMAL HIGH (ref 1.005–1.030)
pH: 7 (ref 5.0–8.0)

## 2023-06-15 LAB — MAGNESIUM: Magnesium: 1.8 mg/dL (ref 1.7–2.4)

## 2023-06-15 LAB — URINE DRUG SCREEN, QUALITATIVE (ARMC ONLY)
Amphetamines, Ur Screen: NOT DETECTED
Barbiturates, Ur Screen: NOT DETECTED
Benzodiazepine, Ur Scrn: NOT DETECTED
Cannabinoid 50 Ng, Ur ~~LOC~~: POSITIVE — AB
Cocaine Metabolite,Ur ~~LOC~~: NOT DETECTED
MDMA (Ecstasy)Ur Screen: NOT DETECTED
Methadone Scn, Ur: NOT DETECTED
Opiate, Ur Screen: NOT DETECTED
Phencyclidine (PCP) Ur S: NOT DETECTED
Tricyclic, Ur Screen: POSITIVE — AB

## 2023-06-15 LAB — COMPREHENSIVE METABOLIC PANEL
ALT: 26 U/L (ref 0–44)
AST: 29 U/L (ref 15–41)
Albumin: 4.6 g/dL (ref 3.5–5.0)
Alkaline Phosphatase: 57 U/L (ref 38–126)
Anion gap: 12 (ref 5–15)
BUN: 14 mg/dL (ref 8–23)
CO2: 22 mmol/L (ref 22–32)
Calcium: 9.5 mg/dL (ref 8.9–10.3)
Chloride: 102 mmol/L (ref 98–111)
Creatinine, Ser: 0.91 mg/dL (ref 0.61–1.24)
GFR, Estimated: >60 mL/min (ref >60)
Glucose, Bld: 189 mg/dL — ABNORMAL HIGH (ref 70–99)
Potassium: 3.7 mmol/L (ref 3.5–5.1)
Sodium: 136 mmol/L (ref 135–145)
Total Bilirubin: 0.8 mg/dL (ref 0.3–1.2)
Total Protein: 7.8 g/dL (ref 6.5–8.1)

## 2023-06-15 LAB — SARS CORONAVIRUS 2 BY RT PCR: SARS Coronavirus 2 by RT PCR: NEGATIVE

## 2023-06-15 LAB — TROPONIN I (HIGH SENSITIVITY)
Troponin I (High Sensitivity): 6 ng/L (ref <18)
Troponin I (High Sensitivity): 9 ng/L (ref <18)

## 2023-06-15 LAB — LIPASE, BLOOD: Lipase: 23 U/L (ref 11–51)

## 2023-06-15 MED ORDER — LABETALOL HCL 5 MG/ML IV SOLN
10.0000 mg | Freq: Four times a day (QID) | INTRAVENOUS | Status: DC | PRN
  Administered 2023-06-16 (×2): 10 mg via INTRAVENOUS
  Filled 2023-06-15: qty 4

## 2023-06-15 MED ORDER — HYDROMORPHONE HCL 1 MG/ML IJ SOLN
0.5000 mg | Freq: Once | INTRAMUSCULAR | Status: AC
  Administered 2023-06-15: 0.5 mg via INTRAVENOUS
  Filled 2023-06-15: qty 0.5

## 2023-06-15 MED ORDER — IOHEXOL 300 MG/ML  SOLN
100.0000 mL | Freq: Once | INTRAMUSCULAR | Status: AC | PRN
  Administered 2023-06-15: 100 mL via INTRAVENOUS

## 2023-06-15 MED ORDER — AMLODIPINE BESYLATE 5 MG PO TABS
5.0000 mg | ORAL_TABLET | Freq: Every day | ORAL | Status: DC
  Administered 2023-06-15 – 2023-06-16 (×2): 5 mg via ORAL
  Filled 2023-06-15 (×2): qty 1

## 2023-06-15 MED ORDER — LABETALOL HCL 5 MG/ML IV SOLN
10.0000 mg | Freq: Once | INTRAVENOUS | Status: AC
  Administered 2023-06-15: 10 mg via INTRAVENOUS
  Filled 2023-06-15: qty 4

## 2023-06-15 MED ORDER — RISAQUAD PO CAPS
2.0000 | ORAL_CAPSULE | Freq: Three times a day (TID) | ORAL | Status: DC
  Administered 2023-06-15 – 2023-06-18 (×9): 2 via ORAL
  Filled 2023-06-15 (×9): qty 2

## 2023-06-15 MED ORDER — SODIUM CHLORIDE 0.9 % IV SOLN
500.0000 mg | Freq: Once | INTRAVENOUS | Status: AC
  Administered 2023-06-15: 500 mg via INTRAVENOUS
  Filled 2023-06-15: qty 5

## 2023-06-15 MED ORDER — LISINOPRIL 20 MG PO TABS
20.0000 mg | ORAL_TABLET | Freq: Two times a day (BID) | ORAL | Status: DC
  Administered 2023-06-15 – 2023-06-18 (×7): 20 mg via ORAL
  Filled 2023-06-15 (×5): qty 1
  Filled 2023-06-15: qty 2
  Filled 2023-06-15: qty 1

## 2023-06-15 MED ORDER — AMOXICILLIN-POT CLAVULANATE 875-125 MG PO TABS
1.0000 | ORAL_TABLET | Freq: Two times a day (BID) | ORAL | Status: DC
  Administered 2023-06-15 – 2023-06-16 (×3): 1 via ORAL
  Filled 2023-06-15 (×4): qty 1

## 2023-06-15 MED ORDER — ACETAMINOPHEN 325 MG PO TABS
650.0000 mg | ORAL_TABLET | Freq: Four times a day (QID) | ORAL | Status: DC | PRN
  Administered 2023-06-17: 650 mg via ORAL
  Filled 2023-06-15: qty 2

## 2023-06-15 MED ORDER — METOCLOPRAMIDE HCL 5 MG/ML IJ SOLN
10.0000 mg | Freq: Once | INTRAMUSCULAR | Status: AC
  Administered 2023-06-15: 10 mg via INTRAVENOUS
  Filled 2023-06-15: qty 2

## 2023-06-15 MED ORDER — SODIUM CHLORIDE 0.9 % IV SOLN: INTRAVENOUS | Status: DC

## 2023-06-15 MED ORDER — ONDANSETRON HCL 4 MG/2ML IJ SOLN
4.0000 mg | Freq: Four times a day (QID) | INTRAMUSCULAR | Status: DC | PRN
  Administered 2023-06-15 – 2023-06-17 (×8): 4 mg via INTRAVENOUS
  Filled 2023-06-15 (×7): qty 2

## 2023-06-15 MED ORDER — HYDROMORPHONE HCL 1 MG/ML IJ SOLN
0.5000 mg | INTRAMUSCULAR | Status: DC | PRN
  Administered 2023-06-15 – 2023-06-18 (×15): 0.5 mg via INTRAVENOUS
  Filled 2023-06-15 (×15): qty 0.5

## 2023-06-15 MED ORDER — LACTATED RINGERS IV BOLUS
1000.0000 mL | Freq: Once | INTRAVENOUS | Status: AC
  Administered 2023-06-15: 1000 mL via INTRAVENOUS

## 2023-06-15 MED ORDER — OXYCODONE-ACETAMINOPHEN 5-325 MG PO TABS: 1.0000 | ORAL_TABLET | Freq: Four times a day (QID) | ORAL | Status: DC | PRN

## 2023-06-15 MED ORDER — ORAL CARE MOUTH RINSE: 15.0000 mL | OROMUCOSAL | Status: DC | PRN

## 2023-06-15 MED ORDER — ONDANSETRON HCL 4 MG PO TABS: 4.0000 mg | ORAL_TABLET | Freq: Four times a day (QID) | ORAL | Status: DC | PRN

## 2023-06-15 MED ORDER — ACETAMINOPHEN 325 MG RE SUPP: 650.0000 mg | Freq: Four times a day (QID) | RECTAL | Status: DC | PRN

## 2023-06-15 MED ORDER — ENOXAPARIN SODIUM 40 MG/0.4ML IJ SOSY
40.0000 mg | PREFILLED_SYRINGE | INTRAMUSCULAR | Status: DC
  Administered 2023-06-15 – 2023-06-17 (×3): 40 mg via SUBCUTANEOUS
  Filled 2023-06-15 (×3): qty 0.4

## 2023-06-15 MED ORDER — BACID PO TABS
2.0000 | ORAL_TABLET | Freq: Three times a day (TID) | ORAL | Status: DC
  Filled 2023-06-15 (×2): qty 2

## 2023-06-15 MED ORDER — ACETAMINOPHEN 325 MG PO TABS: 650.0000 mg | ORAL_TABLET | Freq: Four times a day (QID) | ORAL | Status: DC | PRN

## 2023-06-15 MED ORDER — SODIUM CHLORIDE 0.9 % IV SOLN
2.0000 g | Freq: Once | INTRAVENOUS | Status: AC
  Administered 2023-06-15: 2 g via INTRAVENOUS
  Filled 2023-06-15: qty 20

## 2023-06-15 MED ORDER — ONDANSETRON HCL 4 MG/2ML IJ SOLN
4.0000 mg | Freq: Once | INTRAMUSCULAR | Status: AC
  Administered 2023-06-15: 4 mg via INTRAVENOUS
  Filled 2023-06-15: qty 2

## 2023-06-15 MED ORDER — HYOSCYAMINE SULFATE 0.5 MG/ML IJ SOLN: 0.2500 mg | Freq: Four times a day (QID) | INTRAMUSCULAR | Status: DC | PRN

## 2023-06-15 MED ORDER — FINASTERIDE 5 MG PO TABS
5.0000 mg | ORAL_TABLET | Freq: Every day | ORAL | Status: DC
  Administered 2023-06-16 – 2023-06-18 (×3): 5 mg via ORAL
  Filled 2023-06-15 (×3): qty 1

## 2023-06-15 MED ORDER — PANTOPRAZOLE SODIUM 40 MG IV SOLR
40.0000 mg | INTRAVENOUS | Status: DC
  Administered 2023-06-15 – 2023-06-16 (×2): 40 mg via INTRAVENOUS
  Filled 2023-06-15 (×2): qty 10

## 2023-06-15 NOTE — ED Triage Notes (Signed)
Presents to ER via ems from home with c/o n/v/d that started yesterday around 1400.  Pt reports some sweating/chills.  Pt reports fever of 101-102 at home.  States he has not been able to keep anything down, and has not taken any tylenol or motrin this morning.  Ems states temp was 98.6, but that pt feels very warm to the touch.  Pt states he ha vomited multiple times, with last occurrence at 0200.  Pt is otherwise A&O x4 and in NAD at this time.    EMS gave 4 mg IV zofran pta.

## 2023-06-15 NOTE — H&P (Signed)
History and Physical    Keith Ramos ZOX:[id number] DOB: [birth year] DOA: 06/15/2023  PCP: Jaclyn Shaggy, MD (Confirm with patient/family/NH records and if not entered, this has to be entered at [facility] point of entry) Patient coming from: Home  I have personally briefly reviewed patient's old medical records in [facility]  Chief Complaint: Belly hurts, nauseous vomiting  HPI: Keith Ramos is a 67 y.o. male with medical history significant of HTN, GERD, hiatal hernia, came with nauseous vomiting diarrhea abdominal pain.  Symptoms started yesterday, after patient eating " something not agreeable:", First he started to have cramping-like abdominal pain and severe loose bowel and diarrhea several times, denied any tenesmus, then diarrhea stopped and last night patient started to feel nauseous and vomiting nonstop he, initially was stomach content than only the clear liquid none bile nonbloody.  Has had chills but no fever.  Patient has had similar symptoms and presentation November last year in February this year both the patient was hospitalized but no significant etiology spots found.  Patient is to have annual GI follow-up next month.  ED Course: Afebrile, blood pressure significant elevated and tachycardic, blood work showed WBC 16.9, hemoglobin 14.3, creatinine 0.9, bicarb 22, CT abdomen pelvis showed no acute findings intra-abdominal he but right mid and lower lobe infiltrate suspect for pneumonia  Patient was given IV bolus x 1 and ceftriaxone and azithromycin and multiple rounds of as needed Zofran and pain medications.  Review of Systems: As per HPI otherwise 14 point review of systems negative.    Past Medical History:  Diagnosis Date   Hypertension     Past Surgical History:  Procedure Laterality Date   HERNIA REPAIR  68   67 yrs old multiple times since   KNEE ARTHROSCOPY Left 2007     reports that he has quit smoking. He has never used smokeless tobacco. He reports that  he does not use drugs. No history on file for alcohol use.  No Known Allergies  Family History  Problem Relation Age of Onset   Hepatitis B Father    Cirrhosis Father    Colon cancer Maternal Grandfather 66   Pancreatic cancer Maternal Grandmother    Colon cancer Cousin 38   Esophageal cancer Neg Hx    Stomach cancer Neg Hx      Prior to Admission medications   Medication Sig Start Date End Date Taking? Authorizing Provider  acetaminophen (TYLENOL) 325 MG tablet Take 2 tablets (650 mg total) by mouth every 6 (six) hours as needed for mild pain (or Fever >/= 101). 07/31/22   Alford Highland, MD  amLODipine (NORVASC) 5 MG tablet Take 5 mg by mouth daily. Patient not taking: Reported on 07/28/2022    [provider]  finasteride (PROSCAR) 5 MG tablet Take 1 tablet (5 mg total) by mouth daily. 08/01/22   Alford Highland, MD  lisinopril (PRINIVIL,ZESTRIL) 20 MG tablet Take 20 mg by mouth 2 (two) times daily.  Patient not taking: Reported on 07/28/2022    [provider]  oxyCODONE-acetaminophen (PERCOCET/ROXICET) 5-325 MG tablet Take 1 tablet by mouth every 6 (six) hours as needed for moderate pain. 07/31/22   Alford Highland, MD  polyethylene glycol (MIRALAX / GLYCOLAX) 17 g packet Take 17 g by mouth daily as needed for mild constipation. 07/31/22   Alford Highland, MD    Physical Exam: Vitals:   06/15/23 0619 06/15/23 0700 06/15/23 0800 06/15/23 0819  BP: (!) 177/95 (!) 174/102 (!) 149/118 Marland Kitchen)  169/97  Pulse: (!) 113 (!) 117 (!) 119 (!) 108  Resp: 17 (!) 23 14 18   Temp:      TempSrc:      SpO2: 100% 94% 100% 99%  Weight:      Height:        Constitutional: NAD, calm, comfortable Vitals:   06/15/23 0619 06/15/23 0700 06/15/23 0800 06/15/23 0819  BP: (!) 177/95 (!) 174/102 (!) 149/118 (!) 169/97  Pulse: (!) 113 (!) 117 (!) 119 (!) 108  Resp: 17 (!) 23 14 18   Temp:      TempSrc:      SpO2: 100% 94% 100% 99%  Weight:      Height:       Eyes: PERRL, lids  and conjunctivae normal ENMT: Mucous membranes are dry. Posterior pharynx clear of any exudate or lesions.Normal dentition.  Neck: normal, supple, no masses, no thyromegaly Respiratory: clear to auscultation bilaterally, no wheezing, no crackles. Normal respiratory effort. No accessory muscle use.  Cardiovascular: Regular rate and rhythm, no murmurs / rubs / gallops. No extremity edema. 2+ pedal pulses. No carotid bruits.  Abdomen: Tenderness on the periumbilical area, no rebound or guarding, no masses palpated. No hepatosplenomegaly. Bowel sounds positive.  Musculoskeletal: no clubbing / cyanosis. No joint deformity upper and lower extremities. Good ROM, no contractures. Normal muscle tone.  Skin: no rashes, lesions, ulcers. No induration Neurologic: CN 2-12 grossly intact. Sensation intact, DTR normal. Strength 5/5 in all 4.  Psychiatric: Normal judgment and insight. Alert and oriented x 3. Normal mood.     Labs on Admission: I have personally reviewed following labs and imaging studies  CBC: Recent Labs  Lab 06/15/23 0500  WBC 16.9*  NEUTROABS 15.7*  HGB 14.3  HCT 39.9  MCV 87.3  PLT 222   Basic Metabolic Panel: Recent Labs  Lab 06/15/23 0500  NA 136  K 3.7  CL 102  CO2 22  GLUCOSE 189*  BUN 14  CREATININE 0.91  CALCIUM 9.5  MG 1.8   GFR: Estimated Creatinine Clearance: 73.6 mL/min (by C-G formula based on SCr of 0.91 mg/dL). Liver Function Tests: Recent Labs  Lab 06/15/23 0500  AST 29  ALT 26  ALKPHOS 57  BILITOT 0.8  PROT 7.8  ALBUMIN 4.6   Recent Labs  Lab 06/15/23 0500  LIPASE 23   No results for input(s): "AMMONIA" in the last 168 hours. Coagulation Profile: No results for input(s): "INR", "PROTIME" in the last 168 hours. Cardiac Enzymes: No results for input(s): "CKTOTAL", "CKMB", "CKMBINDEX", "TROPONINI" in the last 168 hours. BNP (last 3 results) No results for input(s): "PROBNP" in the last 8760 hours. HbA1C: No results for input(s):  "HGBA1C" in the last 72 hours. CBG: No results for input(s): "GLUCAP" in the last 168 hours. Lipid Profile: No results for input(s): "CHOL", "HDL", "LDLCALC", "TRIG", "CHOLHDL", "LDLDIRECT" in the last 72 hours. Thyroid Function Tests: No results for input(s): "TSH", "T4TOTAL", "FREET4", "T3FREE", "THYROIDAB" in the last 72 hours. Anemia Panel: No results for input(s): "VITAMINB12", "FOLATE", "FERRITIN", "TIBC", "IRON", "RETICCTPCT" in the last 72 hours. Urine analysis:    Component Value Date/Time   COLORURINE YELLOW (A) 06/15/2023 0458   APPEARANCEUR CLEAR (A) 06/15/2023 0458   LABSPEC 1.036 (H) 06/15/2023 0458   PHURINE 7.0 06/15/2023 0458   GLUCOSEU 150 (A) 06/15/2023 0458   HGBUR NEGATIVE 06/15/2023 0458   BILIRUBINUR NEGATIVE 06/15/2023 0458   KETONESUR 5 (A) 06/15/2023 0458   PROTEINUR NEGATIVE 06/15/2023 0458   NITRITE NEGATIVE 06/15/2023  1610   LEUKOCYTESUR NEGATIVE 06/15/2023 0458    Radiological Exams on Admission: DG Chest 2 View  Result Date: 06/15/2023 CLINICAL DATA:  Evaluate for pulmonary opacities EXAM: CHEST - 2 VIEW COMPARISON:  07/28/2022 FINDINGS: Heart size appears normal. No pleural fluid, interstitial edema or airspace opacities. Decreased lung volumes with slight asymmetric elevation of the right hemidiaphragm. Visualized osseous structures are unremarkable. IMPRESSION: Low lung volumes. No acute cardiopulmonary disease. Electronically Signed   By: Signa Kell M.D.   On: 06/15/2023 07:25   CT ABDOMEN PELVIS W CONTRAST  Result Date: 06/15/2023 CLINICAL DATA:  Abdominal pain and tenderness times 12 hours. Diaphoresis and chills also. EXAM: CT ABDOMEN AND PELVIS WITH CONTRAST TECHNIQUE: Multidetector CT imaging of the abdomen and pelvis was performed using the standard protocol following bolus administration of intravenous contrast. RADIATION DOSE REDUCTION: This exam was performed according to the departmental dose-optimization program which includes  automated exposure control, adjustment of the mA and/or kV according to patient size and/or use of iterative reconstruction technique. CONTRAST:  OMNIPAQUE IOHEXOL 300 MG/ML  SOLN COMPARISON:  CTs with IV contrast 07/28/2022 and 03/22/2018 FINDINGS: Lower chest: There are increased patchy peripheral ground-glass opacities in the right middle lobe, to a lesser extent in the right lower lobe, findings concerning for viral pneumonia. Has the patient been tested for COVID? The base of the left lung is clear. There is no pleural effusion. The cardiac size is normal. There is descending aortic atherosclerosis, calcification in the aortic annulus, and three-vessel coronary artery calcification. No pericardial fluid. Asymmetric elevation again noted right hemidiaphragm. Hepatobiliary: The liver is mildly steatotic without mass enhancement. The gallbladder and bile ducts are unremarkable. Pancreas: No abnormality Spleen: No abnormality. Adrenals/Urinary Tract: There is no adrenal or renal mass enhancement. There is a 3 mm caliceal nonobstructive stone in the superior pole right kidney, 2 mm caliceal stone in the inferior pole of the left. There is no other nephrolithiasis no ureteral stones or hydronephrosis. The bladder is unremarkable for the degree of distention. Stomach/Bowel: The stomach distended with air and fluid. This was noted previously and could be due to impaired gastric emptying warranting clinical correlation. The gastric folds are mildly thickened but no more than previously with no masslike thickening. Small bowel without evidence of dilatation or wall thickening. Normal caliber appendix. There is diffuse colonic diverticulosis, without acute inflammatory changes. Vascular/Lymphatic: Aortic atherosclerosis. There are multiple mildly prominent mesenteric root lymph nodes up to 1.2 cm in short axis and greater than typical number of subcentimeter visible mesenteric root nodes, but this was noted on the  last CT and unchanged. Active or prior mesenteric adenitis suspected. There are no mesenteric inflammatory changes associated with this. Reproductive: Uterus and bilateral adnexa are unremarkable. Both testicles are in the scrotal sac. There is a small right scrotal hydrocele. Bilateral vasectomy clips. Other: Surgical changes of inguinal hernia repairs. No recurrent or incarcerated hernia. No free hemorrhage, free fluid or free air. Musculoskeletal: Mild chronic anterior wedging again noted of the T11 vertebral body. Osteopenia and degenerative change of the spine. Acquired spinal stenosis largely due to facet hypertrophy at L4-5. No acute or other significant osseous findings. IMPRESSION: 1. Increased patchy peripheral ground-glass opacities in the right middle lobe and to a lesser extent in the right lower lobe, findings concerning for viral pneumonia. Has the patient been tested for COVID? 2. Aortic and coronary artery atherosclerosis. 3. Nonobstructive nephrolithiasis. 4. Distended stomach with air and fluid. This was noted previously and could be  due to impaired gastric emptying. There is mild thickening of the gastric folds but no masslike thickening. 5. Diverticulosis without evidence of diverticulitis. 6. Mild hepatic steatosis. 7. Mildly prominent mesenteric root lymph nodes similar to the prior study. This may be seen with mesenteric adenitis, viral enteritis and inflammatory bowel disease. 8. Small right scrotal hydrocele. 9. Osteopenia and degenerative change. Acquired spinal stenosis L4-5. Aortic Atherosclerosis (ICD10-I70.0). Electronically Signed   By: Almira Bar M.D.   On: 06/15/2023 06:39    EKG: Independently reviewed.  Sinus tachycardia, chronic nonspecific ST-T changes on lateral leads  Assessment/Plan Principal Problem:   Gastroenteritis Active Problems:   Nausea and vomiting   Abdominal pain  (please populate well all problems here in Problem List. (For example, if patient is on  BP meds at home and you resume or decide to hold them, it is a problem that needs to be her. Same for CAD, COPD, HLD and so on)  Acute gastroenteritis -Still significant dehydration, will start IV fluid -Failed p.o. challenge in the ED, continue supportive care with as needed Zofran and pain medications Levsin as needed for cramping.  Dilaudid for severe pain. -GI prophylaxis  Aspiration pneumonia -Likely secondary to repeated nauseous vomiting -No hypoxic -Start Augmentin -Probiotics  SIRS -Secondary to gastroenteritis, management as above  HTN, uncontrolled -Continue Amlodipine and lisinopril -PRN labetalol   DVT prophylaxis: Lovenox Code Status: Full code Family Communication: None at bedside Disposition Plan: Expect less than 2 midnight hospital stay Consults called: None Admission status: Tele obs   Emeline General MD [facility] Pager [phone]  06/15/2023, 8:36 AM

## 2023-06-15 NOTE — ED Provider Notes (Signed)
[facility] Provider Note    Event Date/Time   First MD Initiated Contact with Patient 06/15/23 [phone]     (approximate)   History   Emesis and Diarrhea (/)   HPI  Keith Ramos is a 67 y.o. male who presents to the ED for evaluation of Emesis and Diarrhea (/)   I review a [facility] ED visit from this past February where he was seen for emesis and abdominal pain.  Reassuring workup including a CT scan and he was discharged home.  Who presents to the ED for evaluation of about 16 hours of abdominal cramping, nausea and emesis.  Reports he had diarrhea around the start of his symptoms yesterday afternoon, but since the vomiting started he has not had anything from below such as flatus or stool.  Severe abdominal cramping and emesis.  No fevers.  Lesser urinary output without dysuria.  No syncope or falls.  No chest pain    Physical Exam   Triage Vital Signs: ED Triage Vitals  Encounter Vitals Group     BP 06/15/23 0451 (!) 174/104     Systolic BP Percentile --      Diastolic BP Percentile --      Pulse Rate 06/15/23 0451 (!) 112     Resp 06/15/23 0451 20     Temp 06/15/23 0451 98.5 F (36.9 C)     Temp Source 06/15/23 0451 Oral     SpO2 06/15/23 0451 100 %     Weight 06/15/23 0455 168 lb (76.2 kg)     Height 06/15/23 0455 5\' 7"  (1.702 m)     Head Circumference --      Peak Flow --      Pain Score 06/15/23 0455 8     Pain Loc --      Pain Education --      Exclude from Growth Chart --     Most recent vital signs: Vitals:   06/15/23 0530 06/15/23 0619  BP: (!) 158/93 (!) 177/95  Pulse: (!) 114 (!) 113  Resp: 20 17  Temp:    SpO2: 97% 100%    General: Awake, no distress.  Clearly uncomfortable CV:  Good peripheral perfusion.  Resp:  Normal effort.  Abd:  No distention.  Diffuse tenderness with some guarding on deeper palpation MSK:  No deformity noted.  Neuro:  No focal deficits appreciated. Other:     ED Results / Procedures /  Treatments   Labs (all labs ordered are listed, but only abnormal results are displayed) Labs Reviewed  COMPREHENSIVE METABOLIC PANEL - Abnormal; Notable for the following components:      Result Value   Glucose, Bld 189 (*)    All other components within normal limits  CBC WITH DIFFERENTIAL/PLATELET - Abnormal; Notable for the following components:   WBC 16.9 (*)    Neutro Abs 15.7 (*)    Lymphs Abs 0.5 (*)    Abs Immature Granulocytes 0.08 (*)    All other components within normal limits  SARS CORONAVIRUS 2 BY RT PCR  LIPASE, BLOOD  MAGNESIUM  URINALYSIS, ROUTINE W REFLEX MICROSCOPIC  TROPONIN I (HIGH SENSITIVITY)  TROPONIN I (HIGH SENSITIVITY)    EKG Sinus tachycardia with rate of 111 bpm.  Abnormal R wave progression.  Nonspecific ST changes with J-point depression to inferior and lateral leads.  Comparison from 10 months ago is fairly similar  RADIOLOGY CT abdomen/pelvis interpreted by me with a distended stomach  Official  radiology report(s): CT ABDOMEN PELVIS W CONTRAST  Result Date: 06/15/2023 CLINICAL DATA:  Abdominal pain and tenderness times 12 hours. Diaphoresis and chills also. EXAM: CT ABDOMEN AND PELVIS WITH CONTRAST TECHNIQUE: Multidetector CT imaging of the abdomen and pelvis was performed using the standard protocol following bolus administration of intravenous contrast. RADIATION DOSE REDUCTION: This exam was performed according to the departmental dose-optimization program which includes automated exposure control, adjustment of the mA and/or kV according to patient size and/or use of iterative reconstruction technique. CONTRAST:  OMNIPAQUE IOHEXOL 300 MG/ML  SOLN COMPARISON:  CTs with IV contrast 07/28/2022 and 03/22/2018 FINDINGS: Lower chest: There are increased patchy peripheral ground-glass opacities in the right middle lobe, to a lesser extent in the right lower lobe, findings concerning for viral pneumonia. Has the patient been tested for COVID? The  base of the left lung is clear. There is no pleural effusion. The cardiac size is normal. There is descending aortic atherosclerosis, calcification in the aortic annulus, and three-vessel coronary artery calcification. No pericardial fluid. Asymmetric elevation again noted right hemidiaphragm. Hepatobiliary: The liver is mildly steatotic without mass enhancement. The gallbladder and bile ducts are unremarkable. Pancreas: No abnormality Spleen: No abnormality. Adrenals/Urinary Tract: There is no adrenal or renal mass enhancement. There is a 3 mm caliceal nonobstructive stone in the superior pole right kidney, 2 mm caliceal stone in the inferior pole of the left. There is no other nephrolithiasis no ureteral stones or hydronephrosis. The bladder is unremarkable for the degree of distention. Stomach/Bowel: The stomach distended with air and fluid. This was noted previously and could be due to impaired gastric emptying warranting clinical correlation. The gastric folds are mildly thickened but no more than previously with no masslike thickening. Small bowel without evidence of dilatation or wall thickening. Normal caliber appendix. There is diffuse colonic diverticulosis, without acute inflammatory changes. Vascular/Lymphatic: Aortic atherosclerosis. There are multiple mildly prominent mesenteric root lymph nodes up to 1.2 cm in short axis and greater than typical number of subcentimeter visible mesenteric root nodes, but this was noted on the last CT and unchanged. Active or prior mesenteric adenitis suspected. There are no mesenteric inflammatory changes associated with this. Reproductive: Uterus and bilateral adnexa are unremarkable. Both testicles are in the scrotal sac. There is a small right scrotal hydrocele. Bilateral vasectomy clips. Other: Surgical changes of inguinal hernia repairs. No recurrent or incarcerated hernia. No free hemorrhage, free fluid or free air. Musculoskeletal: Mild chronic anterior wedging  again noted of the T11 vertebral body. Osteopenia and degenerative change of the spine. Acquired spinal stenosis largely due to facet hypertrophy at L4-5. No acute or other significant osseous findings. IMPRESSION: 1. Increased patchy peripheral ground-glass opacities in the right middle lobe and to a lesser extent in the right lower lobe, findings concerning for viral pneumonia. Has the patient been tested for COVID? 2. Aortic and coronary artery atherosclerosis. 3. Nonobstructive nephrolithiasis. 4. Distended stomach with air and fluid. This was noted previously and could be due to impaired gastric emptying. There is mild thickening of the gastric folds but no masslike thickening. 5. Diverticulosis without evidence of diverticulitis. 6. Mild hepatic steatosis. 7. Mildly prominent mesenteric root lymph nodes similar to the prior study. This may be seen with mesenteric adenitis, viral enteritis and inflammatory bowel disease. 8. Small right scrotal hydrocele. 9. Osteopenia and degenerative change. Acquired spinal stenosis L4-5. Aortic Atherosclerosis (ICD10-I70.0). Electronically Signed   By: Almira Bar M.D.   On: 06/15/2023 06:39    PROCEDURES and  INTERVENTIONS:  .1-3 Lead EKG Interpretation  Performed by: Delton Prairie, MD Authorized by: Delton Prairie, MD     Interpretation: abnormal     ECG rate:  108   ECG rate assessment: tachycardic     Rhythm: sinus tachycardia     Ectopy: none     Conduction: normal     Medications  lactated ringers bolus 1,000 mL (1,000 mLs Intravenous New Bag/Given 06/15/23 0503)  ondansetron (ZOFRAN) injection 4 mg (4 mg Intravenous Given 06/15/23 0505)  HYDROmorphone (DILAUDID) injection 0.5 mg (0.5 mg Intravenous Given 06/15/23 0505)  iohexol (OMNIPAQUE) 300 MG/ML solution 100 mL (100 mLs Intravenous Contrast Given 06/15/23 0604)  HYDROmorphone (DILAUDID) injection 0.5 mg (0.5 mg Intravenous Given 06/15/23 0630)  metoCLOPramide (REGLAN) injection 10 mg (10 mg  Intravenous Given 06/15/23 0629)     IMPRESSION / MDM / ASSESSMENT AND PLAN / ED COURSE  I reviewed the triage vital signs and the nursing notes.  Differential diagnosis includes, but is not limited to, SBO, gastroenteritis, ACS, dehydration, kidney stone, UTI  {Patient presents with symptoms of an acute illness or injury that is potentially life-threatening.  Patient presents with nausea, vomiting and diarrhea.  Looks quite uncomfortable and has a fair amount of tenderness on exam, tachycardic but stable.  Blood work with leukocytosis but normal electrolytes, lipase and troponin level.  Doubt ACS.  Initially denying any cardiopulmonary complaints but his CT abdomen/pelvis partially images a right basilar peripheral infiltrate that we will obtain a 2 view CXR to further assess.  We will swab for COVID.   If his symptoms and tachycardia persist, he may require admission.   Clinical Course as of 06/15/23 0102  [facility] Jun 15, 2023  0622 Reassessed, still awaiting CT.  Reports initial medications helped a fair amount, but symptoms are returning he is asking for more.  We will redose the Dilaudid and provide some Reglan. [DS]  [phone] After CT results, reassessed and clarified respiratory symptoms and reexamined his lungs.  Clear lungs.  He reports he had a cough "when all this started" yesterday but nothing persisting, no fevers or chest pain.  Again urged him to provide a urine sample, he is now able to do so. [DS]    Clinical Course User Index [DS] Delton Prairie, MD     FINAL CLINICAL IMPRESSION(S) / ED DIAGNOSES   Final diagnoses:  Nausea vomiting and diarrhea  Generalized abdominal pain     Rx / DC Orders   ED Discharge Orders     None        Note:  This document was prepared using Dragon voice recognition software and may include unintentional dictation errors.   Delton Prairie, MD 06/15/23 [phone]

## 2023-06-15 NOTE — Plan of Care (Signed)

## 2023-06-15 NOTE — ED Provider Notes (Signed)
Care assumed at 7 AM. Briefly, pt is a 67 yo M here with n/v, cough, fatigue. Pt has had fever at home as well. CT scan shows patchy GGO in RML and LLL c/f viral PNA, distended stomach with air and fluid. CXR shows low lung volumes w/o acute abnormality. Pt has had persistent vomiting, tachycardia here. Will admit to medicine.    Shaune Pollack, MD 06/15/23 1140

## 2023-06-16 DIAGNOSIS — N400 Enlarged prostate without lower urinary tract symptoms: Secondary | ICD-10-CM | POA: Diagnosis present

## 2023-06-16 DIAGNOSIS — Z87891 Personal history of nicotine dependence: Secondary | ICD-10-CM | POA: Diagnosis not present

## 2023-06-16 DIAGNOSIS — K760 Fatty (change of) liver, not elsewhere classified: Secondary | ICD-10-CM | POA: Diagnosis present

## 2023-06-16 DIAGNOSIS — Z79899 Other long term (current) drug therapy: Secondary | ICD-10-CM | POA: Diagnosis not present

## 2023-06-16 DIAGNOSIS — K219 Gastro-esophageal reflux disease without esophagitis: Secondary | ICD-10-CM | POA: Diagnosis present

## 2023-06-16 DIAGNOSIS — Z8719 Personal history of other diseases of the digestive system: Secondary | ICD-10-CM | POA: Diagnosis not present

## 2023-06-16 DIAGNOSIS — E785 Hyperlipidemia, unspecified: Secondary | ICD-10-CM | POA: Diagnosis present

## 2023-06-16 DIAGNOSIS — Z1152 Encounter for screening for COVID-19: Secondary | ICD-10-CM | POA: Diagnosis not present

## 2023-06-16 DIAGNOSIS — I700 Atherosclerosis of aorta: Secondary | ICD-10-CM | POA: Diagnosis present

## 2023-06-16 DIAGNOSIS — K5730 Diverticulosis of large intestine without perforation or abscess without bleeding: Secondary | ICD-10-CM | POA: Diagnosis present

## 2023-06-16 DIAGNOSIS — Z800 Family history of malignant neoplasm of digestive organs: Secondary | ICD-10-CM | POA: Diagnosis not present

## 2023-06-16 DIAGNOSIS — R112 Nausea with vomiting, unspecified: Secondary | ICD-10-CM | POA: Diagnosis not present

## 2023-06-16 DIAGNOSIS — I10 Essential (primary) hypertension: Secondary | ICD-10-CM | POA: Diagnosis present

## 2023-06-16 DIAGNOSIS — N433 Hydrocele, unspecified: Secondary | ICD-10-CM | POA: Diagnosis present

## 2023-06-16 DIAGNOSIS — I2510 Atherosclerotic heart disease of native coronary artery without angina pectoris: Secondary | ICD-10-CM | POA: Diagnosis present

## 2023-06-16 DIAGNOSIS — K449 Diaphragmatic hernia without obstruction or gangrene: Secondary | ICD-10-CM | POA: Diagnosis present

## 2023-06-16 DIAGNOSIS — J690 Pneumonitis due to inhalation of food and vomit: Secondary | ICD-10-CM | POA: Diagnosis present

## 2023-06-16 DIAGNOSIS — R6510 Systemic inflammatory response syndrome (SIRS) of non-infectious origin without acute organ dysfunction: Secondary | ICD-10-CM | POA: Diagnosis present

## 2023-06-16 DIAGNOSIS — N200 Calculus of kidney: Secondary | ICD-10-CM | POA: Diagnosis present

## 2023-06-16 DIAGNOSIS — E860 Dehydration: Secondary | ICD-10-CM | POA: Diagnosis present

## 2023-06-16 DIAGNOSIS — K529 Noninfective gastroenteritis and colitis, unspecified: Secondary | ICD-10-CM | POA: Diagnosis present

## 2023-06-16 DIAGNOSIS — R1084 Generalized abdominal pain: Secondary | ICD-10-CM | POA: Diagnosis not present

## 2023-06-16 LAB — CBC
HCT: 36.9 % — ABNORMAL LOW (ref 39.0–52.0)
Hemoglobin: 12.7 g/dL — ABNORMAL LOW (ref 13.0–17.0)
MCH: 31.5 pg (ref 26.0–34.0)
MCHC: 34.4 g/dL (ref 30.0–36.0)
MCV: 91.6 fL (ref 80.0–100.0)
Platelets: 179 10*3/uL (ref 150–400)
RBC: 4.03 MIL/uL — ABNORMAL LOW (ref 4.22–5.81)
RDW: 12.8 % (ref 11.5–15.5)
WBC: 8.3 10*3/uL (ref 4.0–10.5)
nRBC: 0 % (ref 0.0–0.2)

## 2023-06-16 MED ORDER — HYOSCYAMINE SULFATE 0.125 MG PO TABS
0.2500 mg | ORAL_TABLET | Freq: Four times a day (QID) | ORAL | Status: DC | PRN
  Filled 2023-06-16 (×2): qty 2

## 2023-06-16 MED ORDER — HYDRALAZINE HCL 25 MG PO TABS
25.0000 mg | ORAL_TABLET | Freq: Three times a day (TID) | ORAL | Status: DC
  Administered 2023-06-16 – 2023-06-18 (×4): 25 mg via ORAL
  Filled 2023-06-16 (×4): qty 1

## 2023-06-16 MED ORDER — LABETALOL HCL 5 MG/ML IV SOLN
10.0000 mg | INTRAVENOUS | Status: DC | PRN
  Administered 2023-06-16 – 2023-06-17 (×3): 10 mg via INTRAVENOUS
  Filled 2023-06-16 (×3): qty 4

## 2023-06-16 MED ORDER — AMLODIPINE BESYLATE 10 MG PO TABS
10.0000 mg | ORAL_TABLET | Freq: Every day | ORAL | Status: DC
  Administered 2023-06-17 – 2023-06-18 (×2): 10 mg via ORAL
  Filled 2023-06-16 (×2): qty 1

## 2023-06-16 MED ORDER — PANTOPRAZOLE SODIUM 40 MG IV SOLR
40.0000 mg | Freq: Two times a day (BID) | INTRAVENOUS | Status: DC
  Administered 2023-06-16 – 2023-06-18 (×4): 40 mg via INTRAVENOUS
  Filled 2023-06-16 (×4): qty 10

## 2023-06-16 MED ORDER — HYDRALAZINE HCL 25 MG PO TABS: 25.0000 mg | ORAL_TABLET | Freq: Three times a day (TID) | ORAL | Status: DC

## 2023-06-16 MED ORDER — SODIUM CHLORIDE 0.9 % IV SOLN
12.5000 mg | Freq: Three times a day (TID) | INTRAVENOUS | Status: DC | PRN
  Administered 2023-06-16: 12.5 mg via INTRAVENOUS
  Filled 2023-06-16: qty 12.5
  Filled 2023-06-16: qty 0.5

## 2023-06-16 MED ORDER — HYOSCYAMINE SULFATE 0.125 MG PO TBDP
0.2500 mg | ORAL_TABLET | Freq: Four times a day (QID) | ORAL | Status: DC | PRN
  Filled 2023-06-16: qty 2

## 2023-06-16 NOTE — Progress Notes (Signed)
[facility]  - [place] [facility]   PATIENT NAME: Keith Ramos    MR#:  [id number]  DATE OF BIRTH:  January 06, 1956  SUBJECTIVE:   Patient seen earlier. No family at bedside. Tells me he had barbecue with slaw and next day ate steak and came in with vomiting but stomach upset and diarrhea. Continues to feel burning in his chest. Protonix did help. He is somewhat nauseous. Blood pressure bit on the higher side. Received IV labetalol.   VITALS:  Blood pressure (!) 179/98, pulse 79, temperature 98.4 F (36.9 C), resp. rate 16, height 5\' 7"  (1.702 m), weight 76.2 kg, SpO2 100%.  PHYSICAL EXAMINATION:   GENERAL:  67 y.o.-year-old patient with mild acute distres due to nausea LUNGS: Normal breath sounds bilaterally, no wheezing CARDIOVASCULAR: S1, S2 normal. No murmur   ABDOMEN: Soft, nontender, nondistended. Bowel sounds present.  EXTREMITIES: No  edema b/l.    NEUROLOGIC: nonfocal  patient is alert and awake  LABORATORY PANEL:  CBC Recent Labs  Lab 06/16/23 0439  WBC 8.3  HGB 12.7*  HCT 36.9*  PLT 179    Chemistries  Recent Labs  Lab 06/15/23 0500  NA 136  K 3.7  CL 102  CO2 22  GLUCOSE 189*  BUN 14  CREATININE 0.91  CALCIUM 9.5  MG 1.8  AST 29  ALT 26  ALKPHOS 57  BILITOT 0.8   Cardiac Enzymes No results for input(s): "TROPONINI" in the last 168 hours. RADIOLOGY:  DG Chest 2 View  Result Date: 06/15/2023 CLINICAL DATA:  Evaluate for pulmonary opacities EXAM: CHEST - 2 VIEW COMPARISON:  07/28/2022 FINDINGS: Heart size appears normal. No pleural fluid, interstitial edema or airspace opacities. Decreased lung volumes with slight asymmetric elevation of the right hemidiaphragm. Visualized osseous structures are unremarkable. IMPRESSION: Low lung volumes. No acute cardiopulmonary disease. Electronically Signed   By: Signa Kell M.D.   On: 06/15/2023 07:25   CT ABDOMEN PELVIS W CONTRAST  Result Date: 06/15/2023 CLINICAL DATA:  Abdominal pain and  tenderness times 12 hours. Diaphoresis and chills also. EXAM: CT ABDOMEN AND PELVIS WITH CONTRAST TECHNIQUE: Multidetector CT imaging of the abdomen and pelvis was performed using the standard protocol following bolus administration of intravenous contrast. RADIATION DOSE REDUCTION: This exam was performed according to the departmental dose-optimization program which includes automated exposure control, adjustment of the mA and/or kV according to patient size and/or use of iterative reconstruction technique. CONTRAST:  OMNIPAQUE IOHEXOL 300 MG/ML  SOLN COMPARISON:  CTs with IV contrast 07/28/2022 and 03/22/2018 FINDINGS: Lower chest: There are increased patchy peripheral ground-glass opacities in the right middle lobe, to a lesser extent in the right lower lobe, findings concerning for viral pneumonia. Has the patient been tested for COVID? The base of the left lung is clear. There is no pleural effusion. The cardiac size is normal. There is descending aortic atherosclerosis, calcification in the aortic annulus, and three-vessel coronary artery calcification. No pericardial fluid. Asymmetric elevation again noted right hemidiaphragm. Hepatobiliary: The liver is mildly steatotic without mass enhancement. The gallbladder and bile ducts are unremarkable. Pancreas: No abnormality Spleen: No abnormality. Adrenals/Urinary Tract: There is no adrenal or renal mass enhancement. There is a 3 mm caliceal nonobstructive stone in the superior pole right kidney, 2 mm caliceal stone in the inferior pole of the left. There is no other nephrolithiasis no ureteral stones or hydronephrosis. The bladder is unremarkable for the degree of distention. Stomach/Bowel: The stomach distended with air and fluid. This  was noted previously and could be due to impaired gastric emptying warranting clinical correlation. The gastric folds are mildly thickened but no more than previously with no masslike thickening. Small bowel without evidence  of dilatation or wall thickening. Normal caliber appendix. There is diffuse colonic diverticulosis, without acute inflammatory changes. Vascular/Lymphatic: Aortic atherosclerosis. There are multiple mildly prominent mesenteric root lymph nodes up to 1.2 cm in short axis and greater than typical number of subcentimeter visible mesenteric root nodes, but this was noted on the last CT and unchanged. Active or prior mesenteric adenitis suspected. There are no mesenteric inflammatory changes associated with this. Reproductive: Uterus and bilateral adnexa are unremarkable. Both testicles are in the scrotal sac. There is a small right scrotal hydrocele. Bilateral vasectomy clips. Other: Surgical changes of inguinal hernia repairs. No recurrent or incarcerated hernia. No free hemorrhage, free fluid or free air. Musculoskeletal: Mild chronic anterior wedging again noted of the T11 vertebral body. Osteopenia and degenerative change of the spine. Acquired spinal stenosis largely due to facet hypertrophy at L4-5. No acute or other significant osseous findings. IMPRESSION: 1. Increased patchy peripheral ground-glass opacities in the right middle lobe and to a lesser extent in the right lower lobe, findings concerning for viral pneumonia. Has the patient been tested for COVID? 2. Aortic and coronary artery atherosclerosis. 3. Nonobstructive nephrolithiasis. 4. Distended stomach with air and fluid. This was noted previously and could be due to impaired gastric emptying. There is mild thickening of the gastric folds but no masslike thickening. 5. Diverticulosis without evidence of diverticulitis. 6. Mild hepatic steatosis. 7. Mildly prominent mesenteric root lymph nodes similar to the prior study. This may be seen with mesenteric adenitis, viral enteritis and inflammatory bowel disease. 8. Small right scrotal hydrocele. 9. Osteopenia and degenerative change. Acquired spinal stenosis L4-5. Aortic Atherosclerosis (ICD10-I70.0).  Electronically Signed   By: Almira Bar M.D.   On: 06/15/2023 06:39    Assessment and Plan [facility] is a 67 y.o. male with medical history significant of HTN, GERD, hiatal hernia, came with nauseous vomiting diarrhea abdominal pain.  Symptoms started yesterday, after patient eating " something not agreeable:", First he started to have cramping-like abdominal pain and severe loose bowel and diarrhea several times.  CT abdomen pelvis showed no acute findings intra-abdominal he but right mid and lower lobe infiltrate suspect for pneumonia ]  Acute gastroenteritis --with clinical dehydration--received IVF --no vomiting but has nausea--add phenergan --GI prophylaxis --CLD if pt wants --CT abd--neg   Abnormal CT finidng -- patient clinically does not have pneumonia. He had some similar changes in previous CT as well. Denies any cough fever. Patient currently not vomiting. -- I will hold off on oral antibiotic for now. If clinically indicated then start. This was discussed with patient and he is agreeable -No hypoxic  SIRS -Secondary to gastroenteritis, management as above   HTN, uncontrolled -Continue Amlodipine and lisinopril -PRN labetalol     DVT prophylaxis: Lovenox Code Status: Full code Family Communication: None at bedside Level of care: Telemetry Medical Status is: Inpatient Remains inpatient appropriate because: Acute GE    TOTAL TIME TAKING CARE OF THIS PATIENT: 35 minutes.  >50% time spent on counselling and coordination of care  Note: This dictation was prepared with Dragon dictation along with smaller phrase technology. Any transcriptional errors that result from this process are unintentional.  Enedina Finner M.D    [facility]   CC: Primary care physician; Jaclyn Shaggy, MD

## 2023-06-16 NOTE — Plan of Care (Signed)
  Problem: Education: Goal: Knowledge of General Education information will improve Description Including pain rating scale, medication(s)/side effects and non-pharmacologic comfort measures Outcome: Progressing   Problem: Health Behavior/Discharge Planning: Goal: Ability to manage health-related needs will improve Outcome: Progressing   

## 2023-06-16 NOTE — Progress Notes (Signed)
BP continues to be elevated. MD requested that labetalol be repeated

## 2023-06-16 NOTE — Progress Notes (Signed)
BP has remained elevated throughout the day. MD was  aware. IVF stopped and different meds used to help BP such as home meds and prn labetalol. Just recently hydralazine was added and given to the patient

## 2023-06-17 ENCOUNTER — Inpatient Hospital Stay: Payer: PPO | Admitting: General Practice

## 2023-06-17 ENCOUNTER — Encounter
Admission: EM | Disposition: A | Discharge status: Home / Self Care | Payer: Self-pay | Source: Home / Self Care | Attending: Internal Medicine

## 2023-06-17 ENCOUNTER — Encounter: Payer: Self-pay | Admitting: Internal Medicine

## 2023-06-17 DIAGNOSIS — R112 Nausea with vomiting, unspecified: Secondary | ICD-10-CM

## 2023-06-17 DIAGNOSIS — R1084 Generalized abdominal pain: Secondary | ICD-10-CM

## 2023-06-17 DIAGNOSIS — K529 Noninfective gastroenteritis and colitis, unspecified: Secondary | ICD-10-CM | POA: Diagnosis not present

## 2023-06-17 HISTORY — PX: ESOPHAGOGASTRODUODENOSCOPY: SHX5428

## 2023-06-17 SURGERY — EGD (ESOPHAGOGASTRODUODENOSCOPY)
Anesthesia: General

## 2023-06-17 MED ORDER — SUCCINYLCHOLINE CHLORIDE 200 MG/10ML IV SOSY
PREFILLED_SYRINGE | INTRAVENOUS | Status: DC | PRN
  Administered 2023-06-17: 100 mg via INTRAVENOUS

## 2023-06-17 MED ORDER — PROPOFOL 10 MG/ML IV BOLUS
INTRAVENOUS | Status: AC
  Filled 2023-06-17: qty 20

## 2023-06-17 MED ORDER — SODIUM CHLORIDE 0.9 % IV SOLN: INTRAVENOUS | Status: DC

## 2023-06-17 MED ORDER — EPHEDRINE SULFATE (PRESSORS) 50 MG/ML IJ SOLN
INTRAMUSCULAR | Status: DC | PRN
  Administered 2023-06-17: 10 mg via INTRAVENOUS

## 2023-06-17 MED ORDER — PROPOFOL 10 MG/ML IV BOLUS
INTRAVENOUS | Status: DC | PRN
Start: 2023-06-17 — End: 2023-06-17
  Administered 2023-06-17 (×2): 150 mg via INTRAVENOUS

## 2023-06-17 NOTE — Transfer of Care (Signed)
Immediate Anesthesia Transfer of Care Note  Patient: Keith Ramos  Procedure(s) Performed: ESOPHAGOGASTRODUODENOSCOPY (EGD)  Patient Location: PACU  Anesthesia Type:General  Level of Consciousness: awake, alert , and oriented  Airway & Oxygen Therapy: Patient Spontanous Breathing  Post-op Assessment: Report given to RN and Post -op Vital signs reviewed and stable  Post vital signs: stable  Last Vitals:  Vitals Value Taken Time  BP 130/84 06/17/23 1643  Temp 36.7 C 06/17/23 1643  Pulse 92 06/17/23 1644  Resp 14 06/17/23 1644  SpO2 96 % 06/17/23 1644  Vitals shown include unfiled device data.  Last Pain:  Vitals:   06/17/23 1643  TempSrc:   PainSc: 0-No pain      Patients Stated Pain Goal: 0 (06/16/23 0434)  Complications: No notable events documented.

## 2023-06-17 NOTE — Anesthesia Procedure Notes (Signed)
Procedure Name: Intubation Date/Time: 06/17/2023 4:26 PM  Performed by: Maryla Morrow., CRNAPre-anesthesia Checklist: Patient identified, Patient being monitored, Timeout performed, Emergency Drugs available and Suction available Patient Re-evaluated:Patient Re-evaluated prior to induction Oxygen Delivery Method: Circle system utilized Preoxygenation: Pre-oxygenation with 100% oxygen Induction Type: IV induction Ventilation: Mask ventilation without difficulty Laryngoscope Size: McGraph and 4 Grade View: Grade I Tube type: Oral Tube size: 7.5 mm Number of attempts: 1 Airway Equipment and Method: Stylet Placement Confirmation: ETT inserted through vocal cords under direct vision, positive ETCO2 and breath sounds checked- equal and bilateral Secured at: 21 cm Tube secured with: Tape Dental Injury: Teeth and Oropharynx as per pre-operative assessment

## 2023-06-17 NOTE — Interval H&P Note (Signed)
History and Physical Interval Note:  06/17/2023 4:56 PM  Keith Ramos  has presented today for surgery, with the diagnosis of nausea, vomiting, epigastric pain.  The various methods of treatment have been discussed with the patient and family. After consideration of risks, benefits and other options for treatment, the patient has consented to  Procedure(s): ESOPHAGOGASTRODUODENOSCOPY (EGD) (N/A) as a surgical intervention.  The patient's history has been reviewed, patient examined, no change in status, stable for surgery.  I have reviewed the patient's chart and labs.  Questions were answered to the patient's satisfaction.     [employer], [place]

## 2023-06-17 NOTE — Anesthesia Postprocedure Evaluation (Signed)
Anesthesia Post Note  Patient: Keith Ramos  Procedure(s) Performed: ESOPHAGOGASTRODUODENOSCOPY (EGD)  Patient location during evaluation: Endoscopy Anesthesia Type: General Level of consciousness: awake and alert Pain management: pain level controlled Vital Signs Assessment: post-procedure vital signs reviewed and stable Respiratory status: spontaneous breathing, nonlabored ventilation, respiratory function stable and patient connected to nasal cannula oxygen Cardiovascular status: blood pressure returned to baseline and stable Postop Assessment: no apparent nausea or vomiting Anesthetic complications: no  There were no known notable events for this encounter.   Last Vitals:  Vitals:   06/17/23 1653 06/17/23 1703  BP: 115/80 137/82  Pulse: 84 84  Resp: 12 15  Temp:    SpO2: 96% 98%    Last Pain:  Vitals:   06/17/23 1703  TempSrc:   PainSc: 0-No pain                 Stephanie Coup

## 2023-06-17 NOTE — Consult Note (Signed)
[facility] GI Inpatient Consult Note   Jamey Reas, M.D.  Reason for Consult: Abdominal pain, nausea   Attending Requesting Consult: Delfino Lovett, M.D.  Outpatient Primary Physician:  Dewaine Oats, M.D.  History of Present Illness: Keith Ramos is a 67 y.o. male presenting with acute on chronic nausea and vomiting. He has been seen in numerous ERs and two gastroenterologists (Dr. Midge Minium - 2016, Dr. Yancey Flemings - 2019). Patient underwent previous workup, including: EGD and colonoscopy by DR. Yancey Flemings (04/05/18) :Normal EGD and colonoscopy with tubular adenomas, diverticulosis. UDS on 06/15/23 revealed POS for TCA and Cannabinoids CT abd/pelvis: 06/15/23: IMPRESSION: 1. Increased patchy peripheral ground-glass opacities in the right middle lobe and to a lesser extent in the right lower lobe, findings concerning for viral pneumonia. Has the patient been tested for COVID? 2. Aortic and coronary artery atherosclerosis. 3. Nonobstructive nephrolithiasis. 4. Distended stomach with air and fluid. This was noted previously and could be due to impaired gastric emptying. There is mild thickening of the gastric folds but no masslike thickening. 5. Diverticulosis without evidence of diverticulitis. 6. Mild hepatic steatosis. 7. Mildly prominent mesenteric root lymph nodes similar to the prior study. This may be seen with mesenteric adenitis, viral enteritis and inflammatory bowel disease. 8. Small right scrotal hydrocele. 9. Osteopenia and degenerative change. Acquired spinal stenosis L4-5.   Aortic Atherosclerosis (ICD10-I70.0).     Electronically Signed   By: Almira Bar M.D.   On: 06/15/2023 06:39  Patient reports today after noting some post meal nausea yesterday. This was followed by some self-limited watery diarrhea. He then had some recurrent bilious emesis. He has been hospitalized before without obvious etiology found. Denies hemetemesis. Has been given  empiric antibiotics and antiemetics.  Past Medical History:  Past Medical History:  Diagnosis Date   Hypertension     Problem List: Patient Active Problem List   Diagnosis Date Noted   Gastroenteritis 06/15/2023   Epididymitis, right 07/30/2022   Essential hypertension 07/30/2022   BPH (benign prostatic hyperplasia) 07/30/2022   Sinus tachycardia 07/29/2022   Intractable nausea and vomiting 07/29/2022   Malnutrition of moderate degree 03/24/2018   Nausea & vomiting 03/23/2018   Hypokalemia 03/22/2018   Nausea and vomiting 03/22/2018   Abdominal pain 03/22/2018   COLONIC POLYPS 05/26/2007   PEPTIC STRICTURE 05/26/2007   DIVERTICULOSIS, COLON W/O HEM 05/26/2007   REFLUX, ESOPHAGEAL 04/28/2007   ATROPHY, 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   Past Week   amLODipine (NORVASC) 5 MG tablet Take 5 mg by mouth daily.   06/14/2023   finasteride (PROSCAR) 5 MG tablet Take 1 tablet (5 mg total) by mouth daily. 30 tablet 0 06/14/2023   lisinopril (PRINIVIL,ZESTRIL) 20 MG tablet Take 20 mg by mouth 2 (two) times daily.   06/14/2023   omeprazole (PRILOSEC) 20 MG capsule Take 20 mg by mouth 2 (two) times daily before a meal.   06/14/2023   polyethylene glycol (MIRALAX / GLYCOLAX) 17  g packet Take 17 g by mouth daily as needed for mild constipation. 30 each 0 prn at unk   sildenafil (VIAGRA) 100 MG tablet Take 100 mg by mouth daily as needed for erectile dysfunction.   prn at unk   traMADol (ULTRAM) 50 MG tablet Take 100 mg  by mouth every 8 (eight) hours.   06/14/2023   oxyCODONE-acetaminophen (PERCOCET/ROXICET) 5-325 MG tablet Take 1 tablet by mouth every 6 (six) hours as needed for moderate pain. (Patient not taking: Reported on 06/15/2023) 10 tablet 0 Not Taking   Home medication reconciliation was completed with the patient.   Scheduled Inpatient Medications:    acidophilus  2 capsule Oral TID   amLODipine  10 mg Oral Daily   enoxaparin (LOVENOX) injection  40 mg Subcutaneous Q24H   finasteride  5 mg Oral Daily   hydrALAZINE  25 mg Oral TID   lisinopril  20 mg Oral BID   pantoprazole (PROTONIX) IV  40 mg Intravenous Q12H    Continuous Inpatient Infusions:    promethazine (PHENERGAN) injection (IM or IVPB) Stopped (06/16/23 1001)    PRN Inpatient Medications:  acetaminophen, HYDROmorphone (DILAUDID) injection, labetalol, ondansetron **OR** ondansetron (ZOFRAN) IV, mouth rinse, promethazine (PHENERGAN) injection (IM or IVPB)  Family History: family history includes Cirrhosis in his father; Colon cancer (age of onset: 48) in his cousin; Colon cancer (age of onset: 20) in his maternal grandfather; Hepatitis B in his father; Pancreatic cancer in his maternal grandmother.   GI Family History: Negaitve  Social History:   reports that he has quit smoking. He has never used smokeless tobacco. He reports that he does not use drugs. The patient denies ETOH, tobacco, or drug use.    Review of Systems: Review of Systems - Negative except HPI  Physical Examination: BP 123/84 (BP Location: Left Arm)   Pulse 78   Temp 98 F (36.7 C) (Oral)   Resp 18   Ht 5\' 7"  (1.702 m)   Wt 76.2 kg   SpO2 98%   BMI 26.31 kg/m  Physical Exam Vitals reviewed.  Constitutional:      General: He is not in acute distress.    Appearance: He is normal weight. He is not toxic-appearing.  HENT:     Head: Normocephalic and atraumatic.     Nose: Nose normal.     Mouth/Throat:     Mouth: Mucous membranes are moist.  Eyes:      General: No scleral icterus.       Right eye: No discharge.        Left eye: No discharge.     Extraocular Movements: Extraocular movements intact.     Pupils: Pupils are equal, round, and reactive to light.  Cardiovascular:     Rate and Rhythm: Normal rate.     Pulses: Normal pulses.  Pulmonary:     Effort: Pulmonary effort is normal. No respiratory distress.     Breath sounds: No wheezing.  Abdominal:     General: Abdomen is flat. There is no distension.     Palpations: Abdomen is soft.     Tenderness: There is no abdominal tenderness.  Musculoskeletal:        General: Normal range of motion.     Cervical back: Normal range of motion.  Skin:    General: Skin is warm.     Coloration: Skin is not jaundiced.  Neurological:     General: No focal deficit present.     Mental Status: He is  alert.  Psychiatric:        Mood and Affect: Mood normal.        Behavior: Behavior normal.        Thought Content: Thought content normal.        Judgment: Judgment normal.     Data: Lab Results  Component Value Date   WBC 8.3 06/16/2023   HGB 12.7 (L) 06/16/2023   HCT 36.9 (L) 06/16/2023   MCV 91.6 06/16/2023   PLT 179 06/16/2023   Recent Labs  Lab 06/15/23 0500 06/16/23 0439  HGB 14.3 12.7*   Lab Results  Component Value Date   NA 136 06/15/2023   K 3.7 06/15/2023   CL 102 06/15/2023   CO2 22 06/15/2023   BUN 14 06/15/2023   CREATININE 0.91 06/15/2023   Lab Results  Component Value Date   ALT 26 06/15/2023   AST 29 06/15/2023   ALKPHOS 57 06/15/2023   BILITOT 0.8 06/15/2023   No results for input(s): "APTT", "INR", "PTT" in the last 168 hours.    Latest Ref Rng & Units 06/16/2023    4:39 AM 06/15/2023    5:00 AM 07/30/2022    4:23 AM  CBC  WBC 4.0 - 10.5 K/uL 8.3  16.9  6.2   Hemoglobin 13.0 - 17.0 g/dL 16.1  09.6  04.5   Hematocrit 39.0 - 52.0 % 36.9  39.9  35.1   Platelets 150 - 400 K/uL 179  222  188     STUDIES: No results  found. @IMAGES @  Assessment:  Acute on chronic nausea and vomiting - Ddx includes PUD, chronic H Pylori gastritis, gastroparesis, cannabis hyperemesis, mechanical gastric outlet obstruction, viral etiology.  2. Hypertension  3. GERD  Recommendations: Plan EGD. The patient understands the nature of the planned procedure, indications, risks, alternatives and potential complications including but not limited to bleeding, infection, perforation, damage to internal organs and possible oversedation/side effects from anesthesia. The patient agrees and gives consent to proceed.  Please refer to procedure notes for findings, recommendations and patient disposition/instructions.    Thank you for the consult. Please call with questions or concerns.  Rosina Lowenstein, "Mellody Dance MD [facility] [address] [place], [place] [address] [phone]  06/17/2023 1:05 PM

## 2023-06-17 NOTE — Anesthesia Preprocedure Evaluation (Signed)
Anesthesia Evaluation  Patient identified by MRN, date of birth, ID band Patient awake    Reviewed: Allergy & Precautions, NPO status , Patient's Chart, lab work & pertinent test results  Airway Mallampati: III  TM Distance: >3 FB Neck ROM: full    Dental  (+) Dental Advidsory Given, Edentulous Lower, Edentulous Upper   Pulmonary neg pulmonary ROS, former smoker   Pulmonary exam normal        Cardiovascular hypertension, On Medications negative cardio ROS Normal cardiovascular exam     Neuro/Psych negative neurological ROS  negative psych ROS   GI/Hepatic Neg liver ROS,GERD  Poorly Controlled,,  Endo/Other  negative endocrine ROS    Renal/GU      Musculoskeletal   Abdominal   Peds  Hematology negative hematology ROS (+)   Anesthesia Other Findings Past Medical History: No date: Hypertension  Past Surgical History: 1977: HERNIA REPAIR     Comment:  67 yrs old multiple times since 2007: KNEE ARTHROSCOPY; Left  BMI    Body Mass Index: 26.31 kg/m      Reproductive/Obstetrics negative OB ROS                             Anesthesia Physical Anesthesia Plan  ASA: 2 and emergent  Anesthesia Plan: General ETT and General   Post-op Pain Management:    Induction: Intravenous  PONV Risk Score and Plan: 2 and Ondansetron, Dexamethasone and Midazolam  Airway Management Planned: Oral ETT  Additional Equipment:   Intra-op Plan:   Post-operative Plan: Extubation in OR  Informed Consent: I have reviewed the patients History and Physical, chart, labs and discussed the procedure including the risks, benefits and alternatives for the proposed anesthesia with the patient or authorized representative who has indicated his/her understanding and acceptance.     Dental Advisory Given  Plan Discussed with: Anesthesiologist, CRNA and Surgeon  Anesthesia Plan Comments: (Patient consented  for risks of anesthesia including but not limited to:  - adverse reactions to medications - damage to eyes, teeth, lips or other oral mucosa - nerve damage due to positioning  - sore throat or hoarseness - Damage to heart, brain, nerves, lungs, other parts of body or loss of life  Patient voiced understanding.)       Anesthesia Quick Evaluation

## 2023-06-17 NOTE — Progress Notes (Signed)
Transition of Care [facility]) - Inpatient Brief Assessment   Patient Details  Name: Keith Ramos MRN: [id number] Date of Birth: [birth year]  Transition of Care [facility]) CM/SW Contact:    Darolyn Rua, LCSW Phone Number: 06/17/2023, 9:09 AM   Clinical Narrative:  Patient presents to the ED from home with abdominal cramping, nausea and emesis.  Patient has PCP Dr. Arlana Pouch, from home independent. Insurance: HTA  No TOC needs noted at this time.   Please consult TOC should discharge planning needs arise.   Transition of Care Asessment: Insurance and Status: Insurance coverage has been reviewed Patient has primary care physician: Yes Home environment has been reviewed: from home Prior level of function:: independent Prior/Current Home Services: No current home services Social Determinants of Health Reivew: SDOH reviewed no interventions necessary Readmission risk has been reviewed: Yes Transition of care needs: no transition of care needs at this time

## 2023-06-17 NOTE — Progress Notes (Signed)
[facility]  - [place] [facility]   PATIENT NAME: Keith Ramos    MR#:  [id number]  DATE OF BIRTH:  July 07, 1956  SUBJECTIVE:   Continues to complain of persistent nausea and abdominal pain, hoping to have specialist evaluation including possible endoscopy as he is concerned due to his family history of colon cancer  VITALS:  Blood pressure 123/84, pulse 78, temperature 98 F (36.7 C), temperature source Oral, resp. rate 18, height 5\' 7"  (1.702 m), weight 76.2 kg, SpO2 98%.  PHYSICAL EXAMINATION:   GENERAL:  67 y.o.-year-old patient with mild acute distres due to nausea LUNGS: Normal breath sounds bilaterally, no wheezing CARDIOVASCULAR: S1, S2 normal. No murmur   ABDOMEN: Soft, tenderness in the right lower quadrant.  Nondistended, bowel sounds present EXTREMITIES: No  edema b/l.    NEUROLOGIC: nonfocal  patient is alert and awake  LABORATORY PANEL:  CBC Recent Labs  Lab 06/16/23 0439  WBC 8.3  HGB 12.7*  HCT 36.9*  PLT 179    Chemistries  Recent Labs  Lab 06/15/23 0500  NA 136  K 3.7  CL 102  CO2 22  GLUCOSE 189*  BUN 14  CREATININE 0.91  CALCIUM 9.5  MG 1.8  AST 29  ALT 26  ALKPHOS 57  BILITOT 0.8   Cardiac Enzymes No results for input(s): "TROPONINI" in the last 168 hours. RADIOLOGY:  No results found.  Assessment and Plan Keith Ramos is a 67 y.o. male with medical history significant of HTN, GERD, hiatal hernia, came with nauseous vomiting diarrhea abdominal pain.  Symptoms started yesterday, after patient eating " something not agreeable:", First he started to have cramping-like abdominal pain and severe loose bowel and diarrhea several times.  CT abdomen pelvis showed no acute findings intra-abdominal he but right mid and lower lobe infiltrate suspect for pneumonia ]  Acute gastroenteritis -- Symptomatic management for now - Consult GI, will keep n.p.o. for now --CT abd--neg -He is following with [facility] and has had  several colonoscopies per patient.  Usually every 5 years due to strong family history of cancer.  The last one being in 2017.  He also had EGD about 2 years ago when he had something stuck in his esophagus after eating We will consult GI for further evaluation   SIRS -Secondary to gastroenteritis, management as above   HTN, uncontrolled -Continue Amlodipine and lisinopril.  Add hydralazine for better blood pressure control -PRN labetalol     DVT prophylaxis: Lovenox Code Status: Full code Family Communication: None at bedside Level of care: Telemetry Medical Status is: Inpatient Remains inpatient appropriate because: Acute GE, persistent nausea and abdominal pain    TOTAL TIME TAKING CARE OF THIS PATIENT: 35 minutes.  >50% time spent on counselling and coordination of care  Note: This dictation was prepared with Dragon dictation along with smaller phrase technology. Any transcriptional errors that result from this process are unintentional.  Delfino Lovett M.D    [facility]   CC: Primary care physician; Jaclyn Shaggy, MD

## 2023-06-17 NOTE — H&P (View-Only) (Signed)
[facility] GI Inpatient Consult Note   Jamey Reas, M.D.  Reason for Consult: Abdominal pain, nausea   Attending Requesting Consult: Delfino Lovett, M.D.  Outpatient Primary Physician:  Dewaine Oats, M.D.  History of Present Illness: Keith Ramos is a 67 y.o. male presenting with acute on chronic nausea and vomiting. He has been seen in numerous ERs and two gastroenterologists (Dr. Midge Minium - 2016, Dr. Yancey Flemings - 2019). Patient underwent previous workup, including: EGD and colonoscopy by DR. Yancey Flemings (04/05/18) :Normal EGD and colonoscopy with tubular adenomas, diverticulosis. UDS on 06/15/23 revealed POS for TCA and Cannabinoids CT abd/pelvis: 06/15/23: IMPRESSION: 1. Increased patchy peripheral ground-glass opacities in the right middle lobe and to a lesser extent in the right lower lobe, findings concerning for viral pneumonia. Has the patient been tested for COVID? 2. Aortic and coronary artery atherosclerosis. 3. Nonobstructive nephrolithiasis. 4. Distended stomach with air and fluid. This was noted previously and could be due to impaired gastric emptying. There is mild thickening of the gastric folds but no masslike thickening. 5. Diverticulosis without evidence of diverticulitis. 6. Mild hepatic steatosis. 7. Mildly prominent mesenteric root lymph nodes similar to the prior study. This may be seen with mesenteric adenitis, viral enteritis and inflammatory bowel disease. 8. Small right scrotal hydrocele. 9. Osteopenia and degenerative change. Acquired spinal stenosis L4-5.   Aortic Atherosclerosis (ICD10-I70.0).     Electronically Signed   By: Almira Bar M.D.   On: 06/15/2023 06:39  Patient reports today after noting some post meal nausea yesterday. This was followed by some self-limited watery diarrhea. He then had some recurrent bilious emesis. He has been hospitalized before without obvious etiology found. Denies hemetemesis. Has been given  empiric antibiotics and antiemetics.  Past Medical History:  Past Medical History:  Diagnosis Date   Hypertension     Problem List: Patient Active Problem List   Diagnosis Date Noted   Gastroenteritis 06/15/2023   Epididymitis, right 07/30/2022   Essential hypertension 07/30/2022   BPH (benign prostatic hyperplasia) 07/30/2022   Sinus tachycardia 07/29/2022   Intractable nausea and vomiting 07/29/2022   Malnutrition of moderate degree 03/24/2018   Nausea & vomiting 03/23/2018   Hypokalemia 03/22/2018   Nausea and vomiting 03/22/2018   Abdominal pain 03/22/2018   COLONIC POLYPS 05/26/2007   PEPTIC STRICTURE 05/26/2007   DIVERTICULOSIS, COLON W/O HEM 05/26/2007   REFLUX, ESOPHAGEAL 04/28/2007   ATROPHY, 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   Past Week   amLODipine (NORVASC) 5 MG tablet Take 5 mg by mouth daily.   06/14/2023   finasteride (PROSCAR) 5 MG tablet Take 1 tablet (5 mg total) by mouth daily. 30 tablet 0 06/14/2023   lisinopril (PRINIVIL,ZESTRIL) 20 MG tablet Take 20 mg by mouth 2 (two) times daily.   06/14/2023   omeprazole (PRILOSEC) 20 MG capsule Take 20 mg by mouth 2 (two) times daily before a meal.   06/14/2023   polyethylene glycol (MIRALAX / GLYCOLAX) 17  g packet Take 17 g by mouth daily as needed for mild constipation. 30 each 0 prn at unk   sildenafil (VIAGRA) 100 MG tablet Take 100 mg by mouth daily as needed for erectile dysfunction.   prn at unk   traMADol (ULTRAM) 50 MG tablet Take 100 mg  by mouth every 8 (eight) hours.   06/14/2023   oxyCODONE-acetaminophen (PERCOCET/ROXICET) 5-325 MG tablet Take 1 tablet by mouth every 6 (six) hours as needed for moderate pain. (Patient not taking: Reported on 06/15/2023) 10 tablet 0 Not Taking   Home medication reconciliation was completed with the patient.   Scheduled Inpatient Medications:    acidophilus  2 capsule Oral TID   amLODipine  10 mg Oral Daily   enoxaparin (LOVENOX) injection  40 mg Subcutaneous Q24H   finasteride  5 mg Oral Daily   hydrALAZINE  25 mg Oral TID   lisinopril  20 mg Oral BID   pantoprazole (PROTONIX) IV  40 mg Intravenous Q12H    Continuous Inpatient Infusions:    promethazine (PHENERGAN) injection (IM or IVPB) Stopped (06/16/23 1001)    PRN Inpatient Medications:  acetaminophen, HYDROmorphone (DILAUDID) injection, labetalol, ondansetron **OR** ondansetron (ZOFRAN) IV, mouth rinse, promethazine (PHENERGAN) injection (IM or IVPB)  Family History: family history includes Cirrhosis in his father; Colon cancer (age of onset: 48) in his cousin; Colon cancer (age of onset: 20) in his maternal grandfather; Hepatitis B in his father; Pancreatic cancer in his maternal grandmother.   GI Family History: Negaitve  Social History:   reports that he has quit smoking. He has never used smokeless tobacco. He reports that he does not use drugs. The patient denies ETOH, tobacco, or drug use.    Review of Systems: Review of Systems - Negative except HPI  Physical Examination: BP 123/84 (BP Location: Left Arm)   Pulse 78   Temp 98 F (36.7 C) (Oral)   Resp 18   Ht 5\' 7"  (1.702 m)   Wt 76.2 kg   SpO2 98%   BMI 26.31 kg/m  Physical Exam Vitals reviewed.  Constitutional:      General: He is not in acute distress.    Appearance: He is normal weight. He is not toxic-appearing.  HENT:     Head: Normocephalic and atraumatic.     Nose: Nose normal.     Mouth/Throat:     Mouth: Mucous membranes are moist.  Eyes:      General: No scleral icterus.       Right eye: No discharge.        Left eye: No discharge.     Extraocular Movements: Extraocular movements intact.     Pupils: Pupils are equal, round, and reactive to light.  Cardiovascular:     Rate and Rhythm: Normal rate.     Pulses: Normal pulses.  Pulmonary:     Effort: Pulmonary effort is normal. No respiratory distress.     Breath sounds: No wheezing.  Abdominal:     General: Abdomen is flat. There is no distension.     Palpations: Abdomen is soft.     Tenderness: There is no abdominal tenderness.  Musculoskeletal:        General: Normal range of motion.     Cervical back: Normal range of motion.  Skin:    General: Skin is warm.     Coloration: Skin is not jaundiced.  Neurological:     General: No focal deficit present.     Mental Status: He is  alert.  Psychiatric:        Mood and Affect: Mood normal.        Behavior: Behavior normal.        Thought Content: Thought content normal.        Judgment: Judgment normal.     Data: Lab Results  Component Value Date   WBC 8.3 06/16/2023   HGB 12.7 (L) 06/16/2023   HCT 36.9 (L) 06/16/2023   MCV 91.6 06/16/2023   PLT 179 06/16/2023   Recent Labs  Lab 06/15/23 0500 06/16/23 0439  HGB 14.3 12.7*   Lab Results  Component Value Date   NA 136 06/15/2023   K 3.7 06/15/2023   CL 102 06/15/2023   CO2 22 06/15/2023   BUN 14 06/15/2023   CREATININE 0.91 06/15/2023   Lab Results  Component Value Date   ALT 26 06/15/2023   AST 29 06/15/2023   ALKPHOS 57 06/15/2023   BILITOT 0.8 06/15/2023   No results for input(s): "APTT", "INR", "PTT" in the last 168 hours.    Latest Ref Rng & Units 06/16/2023    4:39 AM 06/15/2023    5:00 AM 07/30/2022    4:23 AM  CBC  WBC 4.0 - 10.5 K/uL 8.3  16.9  6.2   Hemoglobin 13.0 - 17.0 g/dL 16.1  09.6  04.5   Hematocrit 39.0 - 52.0 % 36.9  39.9  35.1   Platelets 150 - 400 K/uL 179  222  188     STUDIES: No results  found. @IMAGES @  Assessment:  Acute on chronic nausea and vomiting - Ddx includes PUD, chronic H Pylori gastritis, gastroparesis, cannabis hyperemesis, mechanical gastric outlet obstruction, viral etiology.  2. Hypertension  3. GERD  Recommendations: Plan EGD. The patient understands the nature of the planned procedure, indications, risks, alternatives and potential complications including but not limited to bleeding, infection, perforation, damage to internal organs and possible oversedation/side effects from anesthesia. The patient agrees and gives consent to proceed.  Please refer to procedure notes for findings, recommendations and patient disposition/instructions.    Thank you for the consult. Please call with questions or concerns.  Rosina Lowenstein, "Mellody Dance MD [facility] [address] [place], [place] [address] [phone]  06/17/2023 1:05 PM

## 2023-06-17 NOTE — Op Note (Signed)
[facility] Patient Name: Keith Ramos Procedure Date: 06/17/2023 3:43 PM MRN: [id number] Account #: [id number] Date of Birth: 04-02-1956 Admit Type: Inpatient Age: 67 Room: [facility] ENDO ROOM 2 Gender: Male Note Status: Finalized Instrument Name: Upper Endoscope [id number] Procedure:             Upper GI endoscopy Indications:           Epigastric abdominal pain, Nausea with vomiting Providers:             Boykin Nearing. Norma Fredrickson MD, MD Referring MD:          Jillene Bucks. Arlana Pouch, MD (Referring MD) Medicines:             Propofol per Anesthesia Complications:         No immediate complications. Procedure:             Pre-Anesthesia Assessment:                        - The risks and benefits of the procedure and the                         sedation options and risks were discussed with the                         patient. All questions were answered and informed                         consent was obtained.                        - Patient identification and proposed procedure were                         verified prior to the procedure by the nurse. The                         procedure was verified in the procedure room.                        - ASA Grade Assessment: III - A patient with severe                         systemic disease.                        - After reviewing the risks and benefits, the patient                         was deemed in satisfactory condition to undergo the                         procedure.                        After obtaining informed consent, the endoscope was                         passed under direct vision. Throughout the procedure,  the patient's blood pressure, pulse, and oxygen                         saturations were monitored continuously. The Endoscope                         was introduced through the mouth, and advanced to the                         third part of duodenum. The upper GI endoscopy  was                         accomplished without difficulty. The patient tolerated                         the procedure well. Findings:      The esophagus was normal.      The stomach was normal.      The examined duodenum was normal. Impression:            - Normal esophagus.                        - Normal stomach.                        - Normal examined duodenum.                        - No specimens collected. Recommendation:        - Return patient to hospital ward for ongoing care.                        - Advance diet as tolerated.                        - Continue present medications.                        - Consider cannabinoid contribution to                         vomiting,patient denies and says only takes CBD.                        - [employer] will sign off for now. Call us back if any                         changes clinically or if we can help for any reason. Procedure Code(s):     --- Professional ---                        [phone], Esophagogastroduodenoscopy, flexible,                         transoral; diagnostic, including collection of                         specimen(s) by brushing or washing, when performed                         (  separate procedure) Diagnosis Code(s):     --- Professional ---                        R11.2, Nausea with vomiting, unspecified                        R10.13, Epigastric pain CPT copyright 2022 [employer]. All rights reserved. The codes documented in this report are preliminary and upon coder review may  be revised to meet current compliance requirements. Stanton Kidney MD, MD 06/17/2023 4:41:09 PM This report has been signed electronically. Number of Addenda: 0 Note Initiated On: 06/17/2023 3:43 PM Estimated Blood Loss:  Estimated blood loss: none.      [facility]

## 2023-06-18 ENCOUNTER — Encounter: Payer: Self-pay | Admitting: Internal Medicine

## 2023-06-18 DIAGNOSIS — R1084 Generalized abdominal pain: Secondary | ICD-10-CM | POA: Diagnosis not present

## 2023-06-18 DIAGNOSIS — R112 Nausea with vomiting, unspecified: Secondary | ICD-10-CM | POA: Diagnosis not present

## 2023-06-18 DIAGNOSIS — K529 Noninfective gastroenteritis and colitis, unspecified: Secondary | ICD-10-CM | POA: Diagnosis not present

## 2023-06-18 LAB — BASIC METABOLIC PANEL
Anion gap: 8 (ref 5–15)
BUN: 17 mg/dL (ref 8–23)
CO2: 24 mmol/L (ref 22–32)
Calcium: 8.8 mg/dL — ABNORMAL LOW (ref 8.9–10.3)
Chloride: 107 mmol/L (ref 98–111)
Creatinine, Ser: 0.9 mg/dL (ref 0.61–1.24)
GFR, Estimated: >60 mL/min (ref >60)
Glucose, Bld: 93 mg/dL (ref 70–99)
Potassium: 3.6 mmol/L (ref 3.5–5.1)
Sodium: 139 mmol/L (ref 135–145)

## 2023-06-18 LAB — CBC
HCT: 39.8 % (ref 39.0–52.0)
Hemoglobin: 13.9 g/dL (ref 13.0–17.0)
MCH: 30.6 pg (ref 26.0–34.0)
MCHC: 34.9 g/dL (ref 30.0–36.0)
MCV: 87.7 fL (ref 80.0–100.0)
Platelets: 201 10*3/uL (ref 150–400)
RBC: 4.54 MIL/uL (ref 4.22–5.81)
RDW: 12.2 % (ref 11.5–15.5)
WBC: 7.7 10*3/uL (ref 4.0–10.5)
nRBC: 0 % (ref 0.0–0.2)

## 2023-06-18 LAB — CULTURE, BLOOD (ROUTINE X 2)
Special Requests: ADEQUATE
Special Requests: ADEQUATE

## 2023-06-18 MED ORDER — LISINOPRIL 10 MG PO TABS: 10.0000 mg | ORAL_TABLET | Freq: Every day | ORAL | Status: DC

## 2023-06-19 NOTE — Discharge Summary (Signed)
Physician Discharge Summary   Patient: Keith Ramos MRN: [id number] DOB: 09/25/1955  Admit date:     06/15/2023  Discharge date: 06/18/2023  Discharge Physician: Delfino Lovett   PCP: Jaclyn Shaggy, MD   Recommendations at discharge:    F/up with outpt providers as requested  Discharge Diagnoses: Principal Problem:   Gastroenteritis Active Problems:   Nausea and vomiting   Abdominal pain   Nausea vomiting and diarrhea  Hospital Course: Assessment and Plan:  Keith Ramos is a 67 y.o. male with medical history significant of HTN, GERD, hiatal hernia, came with nauseous vomiting diarrhea abdominal pain.  Symptoms started yesterday, after patient eating " something not agreeable:", First he started to have cramping-like abdominal pain and severe loose bowel and diarrhea several times.   CT abdomen pelvis showed no acute findings intra-abdominal he but right mid and lower lobe infiltrate suspect for pneumonia ]   Acute gastroenteritis -- symptoms could be from cannabinoid. S/p EGD on 9/25 - wnl --CT abd--neg - Symptoms resolved. Tolerating diet.   HTN        Consultants: GI Procedures performed: EGD  Disposition: Home Diet recommendation:  Discharge Diet Orders (From admission, onward)     Start     Ordered   06/18/23 0000  Diet - low sodium heart healthy        06/18/23 0950           Carb modified diet DISCHARGE MEDICATION: Allergies as of 06/18/2023   No Known Allergies      Medication List     STOP taking these medications    oxyCODONE-acetaminophen 5-325 MG tablet Commonly known as: PERCOCET/ROXICET       TAKE these medications    acetaminophen 325 MG tablet Commonly known as: TYLENOL Take 2 tablets (650 mg total) by mouth every 6 (six) hours as needed for mild pain (or Fever >/= 101).   amLODipine 5 MG tablet Commonly known as: NORVASC Take 5 mg by mouth daily.   finasteride 5 MG tablet Commonly known as: PROSCAR Take 1 tablet (5 mg  total) by mouth daily.   lisinopril 10 MG tablet Commonly known as: ZESTRIL Take 1 tablet (10 mg total) by mouth daily. What changed:  medication strength how much to take when to take this   omeprazole 20 MG capsule Commonly known as: PRILOSEC Take 20 mg by mouth 2 (two) times daily before a meal.   polyethylene glycol 17 g packet Commonly known as: MIRALAX / GLYCOLAX Take 17 g by mouth daily as needed for mild constipation.   sildenafil 100 MG tablet Commonly known as: VIAGRA Take 100 mg by mouth daily as needed for erectile dysfunction.   traMADol 50 MG tablet Commonly known as: ULTRAM Take 100 mg by mouth every 8 (eight) hours.        Follow-up Information     Jaclyn Shaggy, MD. Schedule an appointment as soon as possible for a visit in 1 week(s).   Specialty: Internal Medicine Why: [facility] Discharge F/UP Contact information: [address]   [place] [address] [phone]                Discharge Exam: Ceasar Mons Weights   06/15/23 0455  Weight: 76.2 kg   GENERAL:  67 y.o.-year-old patient with mild acute distres due to nausea LUNGS: Normal breath sounds bilaterally, no wheezing CARDIOVASCULAR: S1, S2 normal. No murmur   ABDOMEN: Soft, tenderness in the right lower quadrant.  Nondistended,  bowel sounds present EXTREMITIES: No  edema b/l.    NEUROLOGIC: nonfocal  patient is alert and awake  Condition at discharge: good  The results of significant diagnostics from this hospitalization (including imaging, microbiology, ancillary and laboratory) are listed below for reference.   Imaging Studies: DG Chest 2 View  Result Date: 06/15/2023 CLINICAL DATA:  Evaluate for pulmonary opacities EXAM: CHEST - 2 VIEW COMPARISON:  07/28/2022 FINDINGS: Heart size appears normal. No pleural fluid, interstitial edema or airspace opacities. Decreased lung volumes with slight asymmetric elevation of the right hemidiaphragm. Visualized osseous structures are  unremarkable. IMPRESSION: Low lung volumes. No acute cardiopulmonary disease. Electronically Signed   By: Signa Kell M.D.   On: 06/15/2023 07:25   CT ABDOMEN PELVIS W CONTRAST  Result Date: 06/15/2023 CLINICAL DATA:  Abdominal pain and tenderness times 12 hours. Diaphoresis and chills also. EXAM: CT ABDOMEN AND PELVIS WITH CONTRAST TECHNIQUE: Multidetector CT imaging of the abdomen and pelvis was performed using the standard protocol following bolus administration of intravenous contrast. RADIATION DOSE REDUCTION: This exam was performed according to the departmental dose-optimization program which includes automated exposure control, adjustment of the mA and/or kV according to patient size and/or use of iterative reconstruction technique. CONTRAST:  OMNIPAQUE IOHEXOL 300 MG/ML  SOLN COMPARISON:  CTs with IV contrast 07/28/2022 and 03/22/2018 FINDINGS: Lower chest: There are increased patchy peripheral ground-glass opacities in the right middle lobe, to a lesser extent in the right lower lobe, findings concerning for viral pneumonia. Has the patient been tested for COVID? The base of the left lung is clear. There is no pleural effusion. The cardiac size is normal. There is descending aortic atherosclerosis, calcification in the aortic annulus, and three-vessel coronary artery calcification. No pericardial fluid. Asymmetric elevation again noted right hemidiaphragm. Hepatobiliary: The liver is mildly steatotic without mass enhancement. The gallbladder and bile ducts are unremarkable. Pancreas: No abnormality Spleen: No abnormality. Adrenals/Urinary Tract: There is no adrenal or renal mass enhancement. There is a 3 mm caliceal nonobstructive stone in the superior pole right kidney, 2 mm caliceal stone in the inferior pole of the left. There is no other nephrolithiasis no ureteral stones or hydronephrosis. The bladder is unremarkable for the degree of distention. Stomach/Bowel: The stomach distended with  air and fluid. This was noted previously and could be due to impaired gastric emptying warranting clinical correlation. The gastric folds are mildly thickened but no more than previously with no masslike thickening. Small bowel without evidence of dilatation or wall thickening. Normal caliber appendix. There is diffuse colonic diverticulosis, without acute inflammatory changes. Vascular/Lymphatic: Aortic atherosclerosis. There are multiple mildly prominent mesenteric root lymph nodes up to 1.2 cm in short axis and greater than typical number of subcentimeter visible mesenteric root nodes, but this was noted on the last CT and unchanged. Active or prior mesenteric adenitis suspected. There are no mesenteric inflammatory changes associated with this. Reproductive: Uterus and bilateral adnexa are unremarkable. Both testicles are in the scrotal sac. There is a small right scrotal hydrocele. Bilateral vasectomy clips. Other: Surgical changes of inguinal hernia repairs. No recurrent or incarcerated hernia. No free hemorrhage, free fluid or free air. Musculoskeletal: Mild chronic anterior wedging again noted of the T11 vertebral body. Osteopenia and degenerative change of the spine. Acquired spinal stenosis largely due to facet hypertrophy at L4-5. No acute or other significant osseous findings. IMPRESSION: 1. Increased patchy peripheral ground-glass opacities in the right middle lobe and to a lesser extent in the right lower lobe, findings  concerning for viral pneumonia. Has the patient been tested for COVID? 2. Aortic and coronary artery atherosclerosis. 3. Nonobstructive nephrolithiasis. 4. Distended stomach with air and fluid. This was noted previously and could be due to impaired gastric emptying. There is mild thickening of the gastric folds but no masslike thickening. 5. Diverticulosis without evidence of diverticulitis. 6. Mild hepatic steatosis. 7. Mildly prominent mesenteric root lymph nodes similar to the prior  study. This may be seen with mesenteric adenitis, viral enteritis and inflammatory bowel disease. 8. Small right scrotal hydrocele. 9. Osteopenia and degenerative change. Acquired spinal stenosis L4-5. Aortic Atherosclerosis (ICD10-I70.0). Electronically Signed   By: Almira Bar M.D.   On: 06/15/2023 06:39    Microbiology: Results for orders placed or performed during the hospital encounter of 06/15/23  SARS Coronavirus 2 by RT PCR (hospital order, performed in [facility] hospital lab) *cepheid single result test* Anterior Nasal Swab     Status: None   Collection Time: 06/15/23  6:44 AM   Specimen: Anterior Nasal Swab  Result Value Ref Range Status   SARS Coronavirus 2 by RT PCR NEGATIVE NEGATIVE Final    Comment: (NOTE) SARS-CoV-2 target nucleic acids are NOT DETECTED.  The SARS-CoV-2 RNA is generally detectable in upper and lower respiratory specimens during the acute phase of infection. The lowest concentration of SARS-CoV-2 viral copies this assay can detect is 250 copies / mL. A negative result does not preclude SARS-CoV-2 infection and should not be used as the sole basis for treatment or other patient management decisions.  A negative result may occur with improper specimen collection / handling, submission of specimen other than nasopharyngeal swab, presence of viral mutation(s) within the areas targeted by this assay, and inadequate number of viral copies (<250 copies / mL). A negative result must be combined with clinical observations, patient history, and epidemiological information.  Fact Sheet for Patients:   RoadLapTop.co.za  Fact Sheet for Healthcare Providers: http://kim-miller.com/  This test is not yet approved or  cleared by the [place] FDA and has been authorized for detection and/or diagnosis of SARS-CoV-2 by FDA under an Emergency Use Authorization (EUA).  This EUA will remain in effect (meaning this test can  be used) for the duration of the COVID-19 declaration under Section 564(b)(1) of the Act, 21 U.S.C. section 360bbb-3(b)(1), unless the authorization is terminated or revoked sooner.  Performed at [facility], [address] [place], [place] [address]   Blood culture (routine x 2)     Status: None (Preliminary result)   Collection Time: 06/15/23  8:25 AM   Specimen: BLOOD  Result Value Ref Range Status   Specimen Description BLOOD RIGHT ANTECUBITAL  Final   Special Requests   Final    BOTTLES DRAWN AEROBIC AND ANAEROBIC Blood Culture adequate volume   Culture   Final    NO GROWTH 4 DAYS Performed at [facility], [address] [place], [place] [address]    Report Status PENDING  Incomplete  Blood culture (routine x 2)     Status: None (Preliminary result)   Collection Time: 06/15/23  8:25 AM   Specimen: BLOOD  Result Value Ref Range Status   Specimen Description BLOOD LEFT ANTECUBITAL  Final   Special Requests   Final    BOTTLES DRAWN AEROBIC AND ANAEROBIC Blood Culture adequate volume   Culture   Final    NO GROWTH 4 DAYS Performed at [facility], [address] [place], [place] [address]  Report Status PENDING  Incomplete    Labs: CBC: Recent Labs  Lab 06/15/23 0500 06/16/23 0439 06/18/23 0600  WBC 16.9* 8.3 7.7  NEUTROABS 15.7*  --   --   HGB 14.3 12.7* 13.9  HCT 39.9 36.9* 39.8  MCV 87.3 91.6 87.7  PLT 222 179 201   Basic Metabolic Panel: Recent Labs  Lab 06/15/23 0500 06/18/23 0600  NA 136 139  K 3.7 3.6  CL 102 107  CO2 22 24  GLUCOSE 189* 93  BUN 14 17  CREATININE 0.91 0.90  CALCIUM 9.5 8.8*  MG 1.8  --    Liver Function Tests: Recent Labs  Lab 06/15/23 0500  AST 29  ALT 26  ALKPHOS 57  BILITOT 0.8  PROT 7.8  ALBUMIN 4.6   CBG: No results for input(s): "GLUCAP" in the last 168 hours.  Discharge time spent: greater than 30 minutes.  Signed: Delfino Lovett, MD [facility] 06/19/2023

## 2023-06-20 LAB — CULTURE, BLOOD (ROUTINE X 2)
Culture: NO GROWTH
Culture: NO GROWTH

## 2023-11-12 ENCOUNTER — Ambulatory Visit (INDEPENDENT_AMBULATORY_CARE_PROVIDER_SITE_OTHER): Payer: PPO | Admitting: Nurse Practitioner

## 2023-11-12 ENCOUNTER — Encounter (INDEPENDENT_AMBULATORY_CARE_PROVIDER_SITE_OTHER): Payer: Self-pay | Admitting: Nurse Practitioner

## 2023-11-12 VITALS — BP 160/90 | HR 80 | Resp 16 | Ht 67.0 in | Wt 172.8 lb

## 2023-11-12 DIAGNOSIS — I10 Essential (primary) hypertension: Secondary | ICD-10-CM | POA: Diagnosis not present

## 2023-11-12 DIAGNOSIS — I83811 Varicose veins of right lower extremities with pain: Secondary | ICD-10-CM

## 2023-11-12 NOTE — Progress Notes (Signed)
 Subjective:    Patient ID: Keith Ramos, male    DOB: 1956/03/02, 68 y.o.   MRN: [id number] Chief Complaint  Patient presents with   New Patient (Initial Visit)    Ref Arlana Pouch consult right le varicose veins    Keith Ramos is a 68 year old male that presents today for evaluation of pain and cramping due to varicosities in the right lower extremity.  He notes that he underwent vein stripping of the left great saphenous vein years ago.  Despite this he has developed numerous large varicosities that traversed from his groin to his lower leg.  He has larger clusters in his medial thigh area.  He also notes that he has some significant cramping at the end of the day and in the evening.  He is extremely active but due to the pain from his varicosities he has had 2 significantly reduced his activities that he is able to do on a daily basis.  He has previously worn medical grade compression but did not notice a significant improvement.  He has been elevating his lower extremities much more since he has been retired.  Currently there is no open wounds or ulcerations.    Review of Systems  Musculoskeletal:        Cramping  All other systems reviewed and are negative.      Objective:   Physical Exam Vitals reviewed.  HENT:     Head: Normocephalic.  Cardiovascular:     Rate and Rhythm: Normal rate.  Pulmonary:     Effort: Pulmonary effort is normal.  Musculoskeletal:        General: Tenderness present.     Right lower leg: Edema present.     Comments: Numerous varicosities all over the right lower extremity  Skin:    General: Skin is warm and dry.  Neurological:     Mental Status: He is alert and oriented to person, place, and time.  Psychiatric:        Mood and Affect: Mood normal.        Behavior: Behavior normal.        Thought Content: Thought content normal.        Judgment: Judgment normal.     BP (!) 160/90   Pulse 80   Resp 16   Ht 5\' 7"  (1.702 m)   Wt 172 lb 12.8 oz (78.4  kg)   BMI 27.06 kg/m   Past Medical History:  Diagnosis Date   Hypertension     Social History   Socioeconomic History   Marital status: Single    Spouse name: Not on file   Number of children: Not on file   Years of education: Not on file   Highest education level: Not on file  Occupational History   Not on file  Tobacco Use   Smoking status: Former   Smokeless tobacco: Never  Vaping Use   Vaping status: Never Used  Substance and Sexual Activity   Alcohol use: Not on file    Comment: occasionally   Drug use: No   Sexual activity: Not on file  Other Topics Concern   Not on file  Social History Narrative   Not on file   Social Drivers of Health   Financial Resource Strain: Not on file  Food Insecurity: No Food Insecurity (06/15/2023)   Hunger Vital Sign    Worried About Running Out of Food in the Last Year: Never true    Ran Out of Food in  the Last Year: Never true  Transportation Needs: No Transportation Needs (06/15/2023)   PRAPARE - Administrator, Civil Service (Medical): No    Lack of Transportation (Non-Medical): No  Physical Activity: Not on file  Stress: Not on file  Social Connections: Not on file  Intimate Partner Violence: Not At Risk (06/15/2023)   Humiliation, Afraid, Rape, and Kick questionnaire    Fear of Current or Ex-Partner: No    Emotionally Abused: No    Physically Abused: No    Sexually Abused: No    Past Surgical History:  Procedure Laterality Date   ESOPHAGOGASTRODUODENOSCOPY N/A 06/17/2023   Procedure: ESOPHAGOGASTRODUODENOSCOPY (EGD);  Surgeon: Toledo, Boykin Nearing, MD;  Location: [facility] ENDOSCOPY;  Service: Gastroenterology;  Laterality: N/A;   HERNIA REPAIR  32   68 yrs old multiple times since   KNEE ARTHROSCOPY Left 2007    Family History  Problem Relation Age of Onset   Hepatitis B Father    Cirrhosis Father    Colon cancer Maternal Grandfather 68   Pancreatic cancer Maternal Grandmother    Colon cancer Cousin 38    Esophageal cancer Neg Hx    Stomach cancer Neg Hx     No Known Allergies     Latest Ref Rng & Units 06/18/2023    6:00 AM 06/16/2023    4:39 AM 06/15/2023    5:00 AM  CBC  WBC 4.0 - 10.5 K/uL 7.7  8.3  16.9   Hemoglobin 13.0 - 17.0 g/dL 40.9  81.1  91.4   Hematocrit 39.0 - 52.0 % 39.8  36.9  39.9   Platelets 150 - 400 K/uL 201  179  222       CMP     Component Value Date/Time   NA 139 06/18/2023 0600   K 3.6 06/18/2023 0600   CL 107 06/18/2023 0600   CO2 24 06/18/2023 0600   GLUCOSE 93 06/18/2023 0600   BUN 17 06/18/2023 0600   CREATININE 0.90 06/18/2023 0600   CALCIUM 8.8 (L) 06/18/2023 0600   PROT 7.8 06/15/2023 0500   ALBUMIN 4.6 06/15/2023 0500   AST 29 06/15/2023 0500   ALT 26 06/15/2023 0500   ALKPHOS 57 06/15/2023 0500   BILITOT 0.8 06/15/2023 0500   GFRNONAA >60 06/18/2023 0600     No results found.     Assessment & Plan:   1. 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 The patient is CEAP [id number]   I have had a long discussion with the patient regarding  varicose veins and why they cause symptoms.  Patient is recommended to utilize graduated compression stockings on daily basis.   Further plans will be based on the ultrasound results   2. Essential hypertension Continue antihypertensive medications as already ordered, these medications have been reviewed and there are no changes at this time.   Current Outpatient Medications on File Prior to Visit  Medication Sig Dispense Refill   amLODipine (NORVASC) 5 MG tablet Take 5 mg by mouth daily.     finasteride (PROSCAR) 5 MG tablet Take 1 tablet (5 mg total) by mouth daily. 30 tablet 0   lisinopril (ZESTRIL) 10 MG tablet Take 1 tablet (10 mg total) by mouth daily.     omeprazole (PRILOSEC) 20 MG capsule Take 20 mg by mouth 2 (two) times daily before a meal.     sildenafil (VIAGRA) 100 MG tablet Take  100 mg by mouth daily as needed for erectile dysfunction.     traMADol (ULTRAM) 50 MG tablet Take 100 mg by mouth every 8 (eight) hours.     acetaminophen (TYLENOL) 325 MG tablet Take 2 tablets (650 mg total) by mouth every 6 (six) hours as needed for mild pain (or Fever >/= 101).     polyethylene glycol (MIRALAX / GLYCOLAX) 17 g packet Take 17 g by mouth daily as needed for mild constipation. 30 each 0   No current facility-administered medications on file prior to visit.    There are no Patient Instructions on file for this visit. No follow-ups on file.   Georgiana Spinner, NP

## 2023-12-11 ENCOUNTER — Other Ambulatory Visit (INDEPENDENT_AMBULATORY_CARE_PROVIDER_SITE_OTHER): Payer: Self-pay | Admitting: Vascular Surgery

## 2023-12-11 DIAGNOSIS — I8391 Asymptomatic varicose veins of right lower extremity: Secondary | ICD-10-CM

## 2023-12-14 ENCOUNTER — Ambulatory Visit (INDEPENDENT_AMBULATORY_CARE_PROVIDER_SITE_OTHER): Payer: PPO

## 2023-12-14 ENCOUNTER — Ambulatory Visit (INDEPENDENT_AMBULATORY_CARE_PROVIDER_SITE_OTHER): Payer: PPO | Admitting: Nurse Practitioner

## 2023-12-14 ENCOUNTER — Encounter (INDEPENDENT_AMBULATORY_CARE_PROVIDER_SITE_OTHER): Payer: Self-pay | Admitting: Nurse Practitioner

## 2023-12-14 VITALS — BP 152/86 | HR 68 | Resp 16 | Wt 172.2 lb

## 2023-12-14 DIAGNOSIS — I10 Essential (primary) hypertension: Secondary | ICD-10-CM | POA: Diagnosis not present

## 2023-12-14 DIAGNOSIS — I8391 Asymptomatic varicose veins of right lower extremity: Secondary | ICD-10-CM

## 2023-12-14 DIAGNOSIS — I83811 Varicose veins of right lower extremities with pain: Secondary | ICD-10-CM

## 2023-12-14 NOTE — Progress Notes (Unsigned)
 Subjective:    Patient ID: Keith Ramos, male    DOB: [birth year], 68 y.o.   MRN: [id number] Chief Complaint  Patient presents with   Follow-up    Pt conv rle reflux     Keith Ramos is a 68 year old male that presents today for evaluation of pain and cramping due to varicosities in the right lower extremity.  He notes that he underwent vein stripping of the right great saphenous vein years ago.  Despite this he has developed numerous large varicosities that traversed from his groin to his lower leg.  He has larger clusters in his medial thigh area.  He also notes that he has some significant cramping at the end of the day and in the evening.  He is extremely active but due to the pain from his varicosities he has had 2 significantly reduced his activities that he is able to do on a daily basis.  He has previously worn medical grade compression but did not notice a significant improvement.  He has been elevating his lower extremities much more since he has been retired.  Currently there is no open wounds or ulcerations.  Today the patient has significant reflux in the right great saphenous vein, however given his reported history of stripping, I suspect this is an accessory saphenous vein .  There is also reflux and some notable varicosities in his lower extremity.    Review of Systems  All other systems reviewed and are negative.      Objective:   Physical Exam Vitals reviewed.  HENT:     Head: Normocephalic.  Cardiovascular:     Rate and Rhythm: Normal rate.  Pulmonary:     Effort: Pulmonary effort is normal.  Skin:    General: Skin is warm and dry.  Neurological:     Mental Status: He is alert and oriented to person, place, and time.  Psychiatric:        Mood and Affect: Mood normal.        Behavior: Behavior normal.        Thought Content: Thought content normal.        Judgment: Judgment normal.     BP (!) 152/86   Pulse 68   Resp 16   Wt 172 lb 3.2 oz (78.1 kg)   BMI  26.97 kg/m   Past Medical History:  Diagnosis Date   Hypertension     Social History   Socioeconomic History   Marital status: Single    Spouse name: Not on file   Number of children: Not on file   Years of education: Not on file   Highest education level: Not on file  Occupational History   Not on file  Tobacco Use   Smoking status: Former   Smokeless tobacco: Never  Vaping Use   Vaping status: Never Used  Substance and Sexual Activity   Alcohol use: Not on file    Comment: occasionally   Drug use: No   Sexual activity: Not on file  Other Topics Concern   Not on file  Social History Narrative   Not on file   Social Drivers of Health   Financial Resource Strain: Not on file  Food Insecurity: No Food Insecurity (06/15/2023)   Hunger Vital Sign    Worried About Running Out of Food in the Last Year: Never true    Ran Out of Food in the Last Year: Never true  Transportation Needs: No Transportation Needs (06/15/2023)  PRAPARE - Administrator, Civil Service (Medical): No    Lack of Transportation (Non-Medical): No  Physical Activity: Not on file  Stress: Not on file  Social Connections: Not on file  Intimate Partner Violence: Not At Risk (06/15/2023)   Humiliation, Afraid, Rape, and Kick questionnaire    Fear of Current or Ex-Partner: No    Emotionally Abused: No    Physically Abused: No    Sexually Abused: No    Past Surgical History:  Procedure Laterality Date   ESOPHAGOGASTRODUODENOSCOPY N/A 06/17/2023   Procedure: ESOPHAGOGASTRODUODENOSCOPY (EGD);  Surgeon: Toledo, Boykin Nearing, MD;  Location: [facility];  Service: Gastroenterology;  Laterality: N/A;   HERNIA REPAIR  34   68 yrs old multiple times since   KNEE ARTHROSCOPY Left 2007    Family History  Problem Relation Age of Onset   Hepatitis B Father    Cirrhosis Father    Colon cancer Maternal Grandfather 2   Pancreatic cancer Maternal Grandmother    Colon cancer Cousin 38    Esophageal cancer Neg Hx    Stomach cancer Neg Hx     No Known Allergies     Latest Ref Rng & Units 06/18/2023    6:00 AM 06/16/2023    4:39 AM 06/15/2023    5:00 AM  CBC  WBC 4.0 - 10.5 K/uL 7.7  8.3  16.9   Hemoglobin 13.0 - 17.0 g/dL 81.1  91.4  78.2   Hematocrit 39.0 - 52.0 % 39.8  36.9  39.9   Platelets 150 - 400 K/uL 201  179  222       CMP     Component Value Date/Time   NA 139 06/18/2023 0600   K 3.6 06/18/2023 0600   CL 107 06/18/2023 0600   CO2 24 06/18/2023 0600   GLUCOSE 93 06/18/2023 0600   BUN 17 06/18/2023 0600   CREATININE 0.90 06/18/2023 0600   CALCIUM 8.8 (L) 06/18/2023 0600   PROT 7.8 06/15/2023 0500   ALBUMIN 4.6 06/15/2023 0500   AST 29 06/15/2023 0500   ALT 26 06/15/2023 0500   ALKPHOS 57 06/15/2023 0500   BILITOT 0.8 06/15/2023 0500   GFRNONAA >60 06/18/2023 0600     No results found.     Assessment & Plan:   1. Varicose veins of right lower extremity with pain (Primary) Recommend:  The patient has had successful ablation of the previously incompetent saphenous venous system but still has persistent symptoms of pain and swelling that are having a negative impact on daily life and daily activities.  Patient should undergo injection foam sclerotherapy to treat the residual varicosities.  The risks, benefits and alternative therapies were reviewed in detail with the patient.  All questions were answered.  The patient agrees to proceed with foam sclerotherapy at their convenience.  The patient will continue wearing the graduated compression stockings and using the over-the-counter pain medications to treat her symptoms.      2. Essential hypertension Continue antihypertensive medications as already ordered, these medications have been reviewed and there are no changes at this time.   Current Outpatient Medications on File Prior to Visit  Medication Sig Dispense Refill   amLODipine (NORVASC) 5 MG tablet Take 5 mg by mouth daily.      finasteride (PROSCAR) 5 MG tablet Take 1 tablet (5 mg total) by mouth daily. 30 tablet 0   lisinopril (ZESTRIL) 10 MG tablet Take 1 tablet (10 mg total) by mouth daily.  omeprazole (PRILOSEC) 20 MG capsule Take 20 mg by mouth 2 (two) times daily before a meal.     sildenafil (VIAGRA) 100 MG tablet Take 100 mg by mouth daily as needed for erectile dysfunction.     traMADol (ULTRAM) 50 MG tablet Take 100 mg by mouth every 8 (eight) hours.     acetaminophen (TYLENOL) 325 MG tablet Take 2 tablets (650 mg total) by mouth every 6 (six) hours as needed for mild pain (or Fever >/= 101).     polyethylene glycol (MIRALAX / GLYCOLAX) 17 g packet Take 17 g by mouth daily as needed for mild constipation. 30 each 0   No current facility-administered medications on file prior to visit.    There are no Patient Instructions on file for this visit. No follow-ups on file.   Georgiana Spinner, NP

## 2024-01-04 ENCOUNTER — Encounter: Payer: Self-pay | Admitting: Internal Medicine

## 2024-01-07 ENCOUNTER — Ambulatory Visit (AMBULATORY_SURGERY_CENTER)

## 2024-01-07 VITALS — Ht 67.0 in | Wt 168.0 lb

## 2024-01-07 DIAGNOSIS — Z860100 Personal history of colon polyps, unspecified: Secondary | ICD-10-CM

## 2024-01-07 DIAGNOSIS — Z800 Family history of malignant neoplasm of digestive organs: Secondary | ICD-10-CM

## 2024-01-07 MED ORDER — NA SULFATE-K SULFATE-MG SULF 17.5-3.13-1.6 GM/177ML PO SOLN: 1.0000 | Freq: Once | ORAL | 0 refills | Status: AC

## 2024-01-07 NOTE — Progress Notes (Signed)
 Pt's name and DOB verified at the beginning of the pre-visit wit 2 identifiers  Pt denies any difficulty with ambulating,sitting, laying down or rolling side to side  Pt has no issues with ambulation   Pt has no issues moving head neck or swallowing  No egg or soy allergy known to patient   No issues known to pt with past sedation with any surgeries or procedures  Pt denies having issues being intubated  No FH of Malignant Hyperthermia  Pt is not on diet pills or shots  Pt is not on home 02   Pt is not on blood thinners   Pt denies issues with constipation   Pt is not on dialysis  Pt denise any abnormal heart rhythms   Pt denies any upcoming cardiac testing  Chart not reviewed by CRNA prior to PV  Visit by phone  Pt states weight is 168 lb  IInstructions reviewed. Pt given, [facility] main # and MD on call # prior to instructions.  Pt states understanding of instructions. Instructed to review again prior to procedure. Pt states they will.

## 2024-01-15 ENCOUNTER — Encounter: Payer: Self-pay | Admitting: Internal Medicine

## 2024-01-21 ENCOUNTER — Ambulatory Visit: Admitting: Internal Medicine

## 2024-01-21 ENCOUNTER — Encounter: Payer: Self-pay | Admitting: Internal Medicine

## 2024-01-21 VITALS — BP 119/60 | HR 68 | Temp 98.4°F | Resp 14 | Ht 67.0 in | Wt 168.0 lb

## 2024-01-21 DIAGNOSIS — Z800 Family history of malignant neoplasm of digestive organs: Secondary | ICD-10-CM

## 2024-01-21 DIAGNOSIS — Z1211 Encounter for screening for malignant neoplasm of colon: Secondary | ICD-10-CM

## 2024-01-21 DIAGNOSIS — Z860100 Personal history of colon polyps, unspecified: Secondary | ICD-10-CM

## 2024-01-21 DIAGNOSIS — D120 Benign neoplasm of cecum: Secondary | ICD-10-CM | POA: Diagnosis not present

## 2024-01-21 DIAGNOSIS — K5730 Diverticulosis of large intestine without perforation or abscess without bleeding: Secondary | ICD-10-CM | POA: Diagnosis not present

## 2024-01-21 MED ORDER — SODIUM CHLORIDE 0.9 % IV SOLN
500.0000 mL | Freq: Once | INTRAVENOUS | Status: DC
Start: 2024-01-21 — End: 2024-01-21

## 2024-01-21 NOTE — Patient Instructions (Signed)
-  Handout on polyps and diverticulosis provided -await pathology results -repeat colonoscopy for surveillance in 5 years recommended  -Continue present medications    YOU HAD AN ENDOSCOPIC PROCEDURE TODAY AT THE [facility]:   Refer to the procedure report that was given to you for any specific questions about what was found during the examination.  If the procedure report does not answer your questions, please call your gastroenterologist to clarify.  If you requested that your care partner not be given the details of your procedure findings, then the procedure report has been included in a sealed envelope for you to review at your convenience later.  YOU SHOULD EXPECT: Some feelings of bloating in the abdomen. Passage of more gas than usual.  Walking can help get rid of the air that was put into your GI tract during the procedure and reduce the bloating. If you had a lower endoscopy (such as a colonoscopy or flexible sigmoidoscopy) you may notice spotting of blood in your stool or on the toilet paper. If you underwent a bowel prep for your procedure, you may not have a normal bowel movement for a few days.  Please Note:  You might notice some irritation and congestion in your nose or some drainage.  This is from the oxygen used during your procedure.  There is no need for concern and it should clear up in a day or so.  SYMPTOMS TO REPORT IMMEDIATELY:  Following lower endoscopy (colonoscopy or flexible sigmoidoscopy):  Excessive amounts of blood in the stool  Significant tenderness or worsening of abdominal pains  Swelling of the abdomen that is new, acute  Fever of 100F or higher  For urgent or emergent issues, a gastroenterologist can be reached at any hour by calling [phone]. Do not use MyChart messaging for urgent concerns.    DIET:  We do recommend a small meal at first, but then you may proceed to your regular diet.  Drink plenty of fluids but you should avoid  alcoholic beverages for 24 hours.  ACTIVITY:  You should plan to take it easy for the rest of today and you should NOT DRIVE or use heavy machinery until tomorrow (because of the sedation medicines used during the test).    FOLLOW UP: Our staff will call the number listed on your records the next business day following your procedure.  We will call around 7:15- 8:00 am to check on you and address any questions or concerns that you may have regarding the information given to you following your procedure. If we do not reach you, we will leave a message.     If any biopsies were taken you will be contacted by phone or by letter within the next 1-3 weeks.  Please call us at [phone] if you have not heard about the biopsies in 3 weeks.    SIGNATURES/CONFIDENTIALITY: You and/or your care partner have signed paperwork which will be entered into your electronic medical record.  These signatures attest to the fact that that the information above on your After Visit Summary has been reviewed and is understood.  Full responsibility of the confidentiality of this discharge information lies with you and/or your care-partner.

## 2024-01-21 NOTE — Op Note (Signed)
 [facility] Patient Name: Keith Ramos Procedure Date: 01/21/2024 11:35 AM MRN: [id number] Endoscopist: Murel Arlington. Elvin Hammer , MD, [id number] Age: 68 Referring MD:  Date of Birth: May 29, 1956 Gender: Male Account #: [id number] Procedure:                Colonoscopy with cold snare polypectomy x 1 Indications:              High risk colon cancer surveillance: Personal                            history of multiple (3 or more) adenomas. Previous                            examinations 2001, 2005, 2019 Medicines:                Monitored Anesthesia Care Procedure:                Pre-Anesthesia Assessment:                           - Prior to the procedure, a History and Physical                            was performed, and patient medications and                            allergies were reviewed. The patient's tolerance of                            previous anesthesia was also reviewed. The risks                            and benefits of the procedure and the sedation                            options and risks were discussed with the patient.                            All questions were answered, and informed consent                            was obtained. Prior Anticoagulants: The patient has                            taken no anticoagulant or antiplatelet agents. ASA                            Grade Assessment: II - A patient with mild systemic                            disease. After reviewing the risks and benefits,                            the patient was deemed in satisfactory condition to  undergo the procedure.                           After obtaining informed consent, the colonoscope                            was passed under direct vision. Throughout the                            procedure, the patient's blood pressure, pulse, and                            oxygen saturations were monitored continuously. The                             Olympus Scope SN: [id number] was introduced through                            the anus and advanced to the the cecum, identified                            by appendiceal orifice and ileocecal valve. The                            ileocecal valve, appendiceal orifice, and rectum                            were photographed. The quality of the bowel                            preparation was good. The colonoscopy was performed                            without difficulty. The patient tolerated the                            procedure well. The bowel preparation used was                            SUPREP via split dose instruction. Scope In: 11:53:57 AM Scope Out: 12:08:17 PM Scope Withdrawal Time: 0 hours 10 minutes 30 seconds  Total Procedure Duration: 0 hours 14 minutes 20 seconds  Findings:                 A 5 mm polyp was found in the cecum. The polyp was                            removed with a cold snare. Resection and retrieval                            were complete.                           Many diverticula were found in the entire colon.  The exam was otherwise without abnormality on                            direct and retroflexion views. Complications:            No immediate complications. Estimated blood loss:                            None. Estimated Blood Loss:     Estimated blood loss: none. Impression:               - One 5 mm polyp in the cecum, removed with a cold                            snare. Resected and retrieved.                           - Diverticulosis in the entire examined colon.                           - The examination was otherwise normal on direct                            and retroflexion views. Recommendation:           - Repeat colonoscopy in 5 years for surveillance.                           - Patient has a contact number available for                            emergencies. The signs and symptoms of potential                             delayed complications were discussed with the                            patient. Return to normal activities tomorrow.                            Written discharge instructions were provided to the                            patient.                           - Resume previous diet.                           - Continue present medications.                           - Await pathology results. Murel Arlington. Elvin Hammer, MD 01/21/2024 12:14:51 PM This report has been signed electronically.

## 2024-01-21 NOTE — Progress Notes (Signed)
 Called to room to assist during endoscopic procedure.  Patient ID and intended procedure confirmed with present staff. Received instructions for my participation in the procedure from the performing physician.

## 2024-01-21 NOTE — Progress Notes (Signed)
 Report to PACU, RN, vss, BBS= Clear.

## 2024-01-21 NOTE — Progress Notes (Signed)
 Pt's states no medical or surgical changes since previsit or office visit.

## 2024-01-21 NOTE — Progress Notes (Signed)
 HISTORY OF PRESENT ILLNESS:  Keith Ramos is a 68 y.o. male with a history of multiple adenomatous colon polyps presents today for surveillance colonoscopy  REVIEW OF SYSTEMS:  All non-GI ROS negative except for  Past Medical History:  Diagnosis Date   Anemia    Chronic kidney disease    stone stone   GERD (gastroesophageal reflux disease)    Hypertension     Past Surgical History:  Procedure Laterality Date   COLONOSCOPY     ESOPHAGOGASTRODUODENOSCOPY N/A 06/17/2023   Procedure: ESOPHAGOGASTRODUODENOSCOPY (EGD);  Surgeon: Toledo, Alphonsus Jeans, MD;  Location: [facility];  Service: Gastroenterology;  Laterality: N/A;   HERNIA REPAIR  1977   68 yrs old multiple times since   KNEE ARTHROSCOPY Left 2007    Social History ALECXANDER Ramos  reports that he has quit smoking. His smoking use included cigarettes. He has never used smokeless tobacco. He reports current alcohol use. He reports that he does not use drugs.  family history includes Cirrhosis in his father; Colon cancer (age of onset: 31) in his cousin; Colon cancer (age of onset: 79) in his maternal grandfather; Hepatitis B in his father; Pancreatic cancer in his maternal grandmother.  No Known Allergies     PHYSICAL EXAMINATION: Vital signs: BP (!) 140/81   Pulse 72   Temp 98.4 F (36.9 C) (Temporal)   Resp 17   Ht 5\' 7"  (1.702 m)   Wt 168 lb (76.2 kg)   SpO2 98%   BMI 26.31 kg/m  General: Well-developed, well-nourished, no acute distress HEENT: Sclerae are anicteric, conjunctiva pink. Oral mucosa intact Lungs: Clear Heart: Regular Abdomen: soft, nontender, nondistended, no obvious ascites, no peritoneal signs, normal bowel sounds. No organomegaly. Extremities: No edema Psychiatric: alert and oriented x3. Cooperative     ASSESSMENT: Personal history of multiple adenomatous polyps    PLAN:  Surveillance colonoscopy

## 2024-01-22 ENCOUNTER — Telehealth: Payer: Self-pay

## 2024-01-22 NOTE — Telephone Encounter (Signed)
  Follow up Call-     01/21/2024   10:58 AM  Call back number  Post procedure Call Back phone  # [phone]  Permission to leave phone message Yes     Patient questions:  Do you have a fever, pain , or abdominal swelling? No. Pain Score  0 *  Have you tolerated food without any problems? Yes.    Have you been able to return to your normal activities? Yes.    Do you have any questions about your discharge instructions: Diet   No. Medications  No. Follow up visit  No.  Do you have questions or concerns about your Care? No.  Actions: * If pain score is 4 or above: No action needed, pain <4.

## 2024-01-25 LAB — SURGICAL PATHOLOGY

## 2024-02-02 DIAGNOSIS — I8310 Varicose veins of unspecified lower extremity with inflammation: Secondary | ICD-10-CM | POA: Insufficient documentation

## 2024-02-02 NOTE — Progress Notes (Signed)
   Indication:  Patient presents with symptomatic varicose veins of the bilateral lower extremity.  Procedure:  Sclerotherapy using hypertonic saline mixed with 1% Lidocaine was performed on the bilateral lower extremity.  Compression wraps were placed.  The patient tolerated the procedure well.  Plan:  Follow up as needed.  

## 2024-02-04 ENCOUNTER — Encounter (INDEPENDENT_AMBULATORY_CARE_PROVIDER_SITE_OTHER): Payer: Self-pay | Admitting: Vascular Surgery

## 2024-02-04 ENCOUNTER — Ambulatory Visit (INDEPENDENT_AMBULATORY_CARE_PROVIDER_SITE_OTHER): Admitting: Vascular Surgery

## 2024-02-04 VITALS — BP 155/82 | HR 73 | Resp 16 | Wt 174.4 lb

## 2024-02-04 DIAGNOSIS — I8310 Varicose veins of unspecified lower extremity with inflammation: Secondary | ICD-10-CM | POA: Diagnosis not present

## 2024-02-08 ENCOUNTER — Ambulatory Visit: Payer: Self-pay | Admitting: Internal Medicine

## 2024-02-09 ENCOUNTER — Encounter (INDEPENDENT_AMBULATORY_CARE_PROVIDER_SITE_OTHER): Payer: Self-pay

## 2024-02-15 ENCOUNTER — Encounter (INDEPENDENT_AMBULATORY_CARE_PROVIDER_SITE_OTHER): Payer: Self-pay | Admitting: Vascular Surgery

## 2024-03-03 ENCOUNTER — Ambulatory Visit (INDEPENDENT_AMBULATORY_CARE_PROVIDER_SITE_OTHER): Admitting: Vascular Surgery

## 2024-03-10 ENCOUNTER — Ambulatory Visit (INDEPENDENT_AMBULATORY_CARE_PROVIDER_SITE_OTHER): Admitting: Vascular Surgery

## 2024-03-10 ENCOUNTER — Encounter (INDEPENDENT_AMBULATORY_CARE_PROVIDER_SITE_OTHER): Payer: Self-pay | Admitting: Vascular Surgery

## 2024-03-10 VITALS — BP 145/82 | HR 80 | Resp 16 | Wt 174.0 lb

## 2024-03-10 DIAGNOSIS — I8310 Varicose veins of unspecified lower extremity with inflammation: Secondary | ICD-10-CM

## 2024-03-10 DIAGNOSIS — I8311 Varicose veins of right lower extremity with inflammation: Secondary | ICD-10-CM

## 2024-03-12 ENCOUNTER — Encounter (INDEPENDENT_AMBULATORY_CARE_PROVIDER_SITE_OTHER): Payer: Self-pay | Admitting: Vascular Surgery

## 2024-03-12 NOTE — Progress Notes (Signed)
Indication:  Patient presents with symptomatic varicose veins of the right lower extremity.  Procedure:  Sclerotherapy using hypertonic saline mixed with 1% Lidocaine was performed on the right lower extremity.  Compression wraps were placed.  The patient tolerated the procedure well.  Plan:  Follow up as needed.   

## 2024-06-15 ENCOUNTER — Ambulatory Visit

## 2024-06-23 ENCOUNTER — Ambulatory Visit (INDEPENDENT_AMBULATORY_CARE_PROVIDER_SITE_OTHER)

## 2024-06-23 VITALS — BP 144/90 | HR 85 | Temp 98.1°F | Ht 67.5 in | Wt 169.4 lb

## 2024-06-23 DIAGNOSIS — R0602 Shortness of breath: Secondary | ICD-10-CM | POA: Insufficient documentation

## 2024-06-23 DIAGNOSIS — R000 Tachycardia, unspecified: Secondary | ICD-10-CM | POA: Diagnosis not present

## 2024-06-23 DIAGNOSIS — R079 Chest pain, unspecified: Secondary | ICD-10-CM | POA: Insufficient documentation

## 2024-06-23 DIAGNOSIS — I10 Essential (primary) hypertension: Secondary | ICD-10-CM

## 2024-06-23 DIAGNOSIS — M1990 Unspecified osteoarthritis, unspecified site: Secondary | ICD-10-CM | POA: Insufficient documentation

## 2024-06-23 DIAGNOSIS — M1993 Secondary osteoarthritis, unspecified site: Secondary | ICD-10-CM

## 2024-06-23 DIAGNOSIS — K219 Gastro-esophageal reflux disease without esophagitis: Secondary | ICD-10-CM | POA: Diagnosis not present

## 2024-06-23 DIAGNOSIS — Z23 Encounter for immunization: Secondary | ICD-10-CM | POA: Diagnosis not present

## 2024-06-23 NOTE — Progress Notes (Signed)
 New Patient Visit   Physician: Ethaniel Garfield A Kyliegh Jester, MD  Patient: Keith Ramos   DOB: 14-Aug-1956   68 y.o. Male  MRN: [id number] Visit Date: 06/23/2024   Chief Complaint  Patient presents with   New Patient (Initial Visit)   Flu Vaccine    Will get today   Subjective  Keith Ramos is a 68 y.o. male who presents today as a new patient to establish care.   HPI  Discussed the use of AI scribe software for clinical note transcription with the patient, who gave verbal consent to proceed.  History of Present Illness   Keith Ramos is a 68 year old male with coronary artery disease who presents with fatigue and exertional discomfort.  Exertional fatigue and discomfort - Significant fatigue and discomfort with exertion, particularly when walking short distances such as 300 yards to the end of his driveway - Describes a tingling sensation and feeling 'uncomfortable' with exertion, as well as a sensation akin to being 'shocked' with chest heaviness - Symptoms began following a COVID-19 infection in 2024 - Difficulty resuming walking due to fatigue and discomfort  Cardiovascular disease and risk factors - History of coronary artery disease - CT scan in 2024 showed aortic atherosclerosis and coronary artery calcification of all arteries - Echocardiogram in 2014 showed a dilated right atrium - Stress test approximately 20 years ago was unremarkable - Family history of early heart disease: both grandfathers had heart attacks in their 57s  Hypertension - Monitors blood pressure at home - Occasional elevations in blood pressure, generally controlled with lisinopril  10 mg daily - History of 'white coat syndrome' - Today in office 144/90  Osteoarthritis and musculoskeletal pain - Osteoarthritis, particularly affecting the left thumb - Left thumb treated with tendon surgery in June - Uses Voltaren gel and takes meloxicam once daily without gastrointestinal side effects - Remains active,  working part-time in [employer]  Venous insufficiency and leg cramps - History of varicose veins, previously limited running due to nocturnal leg cramps - Underwent treatment for varicose veins - sclerotherapy with vascular - Currently finds it difficult to resume walking due to fatigue and discomfort as above  GERD - History of heartburn managed with Prilosec OTC - Upper endoscopy in September 2024 for reflux and previous esophageal stricture      Reports overall healthy diet.    ASSESSMENT & PLAN  Encounter Diagnoses  Name Primary?   Essential hypertension Yes   Gastroesophageal reflux disease without esophagitis    Sinus tachycardia    Chest pain, unspecified type     Orders Placed This Encounter  Procedures   CT CORONARY MORPH W/CTA COR W/SCORE W/CA W/CM &/OR WO/CM   CT Chest Wo Contrast   CBC with Differential/Platelet   Comprehensive metabolic panel with GFR   Hemoglobin A1c   Lipid panel   Urinalysis, Routine w reflex microscopic   PSA   Lipoprotein A (LPA)   EKG 12-Lead   ECHOCARDIOGRAM COMPLETE    Assessment and Plan    Exertional fatigue and discomfort, possible cardiac or pulmonary etiology Differential includes coronary artery disease and or pulmonary fibrosis from COVID-19 past infection. Family history of early heart disease. Previous CT showed coronary calcification and lung opacities. COVID-19 may have accelerated coronary disease as well.  - Order CCTA to assess coronary artery obstruction. - Order chest CT to evaluate lung condition. - Instruct to seek emergency care if experiencing chest pain or heaviness that does  not go away or changes in character  Coronary artery disease with three-vessel coronary calcification and aortic atherosclerosis Coronary artery disease with three-vessel calcification and aortic atherosclerosis. Risk of progression due to COVID-19 related inflammation. Family history of early heart disease. -  Order CCTA to assess coronary artery obstruction. - Check LpA with labs.  ECG today unremakable  Right atrial enlargement and history of murmur Right atrial enlargement and murmur. Previous echocardiogram showed mild dilation. Recent EKG indicated enlargement. Possible transient rhythm issues. - Order echocardiogram to assess atrial size and function.  Hypertension, well-controlled on lisinopril  Hypertension well-controlled on lisinopril  10 mg. History of white coat syndrome. Home blood pressure readings generally acceptable. - Continue lisinopril  10 mg. - Monitor blood pressure at home at different times of the day.  Keep log for fu appt  Osteoarthritis of hands, status post left thumb tendon procedure Severe osteoarthritis in hands, particularly left thumb. Status post tendon procedure. Remains active despite arthritis. - Continue meloxicam for pain management. Volteran gel as needed  Gastroesophageal reflux disease with history of esophageal stricture GERD managed with Prilosec OTC. Previous esophageal stricture requiring intervention. Occasional heartburn related to diet. - Continue omeprazole 20 mg daily  Note he does see dermatology on a regular basis.     F/u in 3 weeks to review imaging/lab results    Objective  BP (!) 144/90 (BP Location: Left Arm, Patient Position: Sitting, Cuff Size: Normal)   Pulse 85   Temp 98.1 F (36.7 C) (Oral)   Ht 5' 7.5 (1.715 m)   Wt 169 lb 6.4 oz (76.8 kg)   BMI 26.14 kg/m      Review of Systems  Constitutional:  Negative for chills, fever and weight loss.  Eyes:  Negative for blurred vision. h Respiratory:  Negative for cough and shortness of breath.   Cardiovascular:  Negative for chest pain and palpitations.  Skin:  Negative for rash.  Psychiatric/Behavioral:  Negative for depression. The patient is not nervous/anxious.      Physical Exam Physical Exam Vitals reviewed.  Constitutional:      Appearance: Normal appearance.  Well-developed with normal weight.  HENT:     Head: Normocephalic and atraumatic.  Normal mucous membranes, no oral lesions Eyes:     Pupils: Pupils are equal, round, and reactive to light.  Neck:     Thyroid: No thyroid mass or thyromegaly.  Cardiovascular:     Rate and Rhythm: Normal rate and regular rhythm. Normal heart sounds. Normal peripheral pulses Pulmonary:     Normal breath sounds with normal effort Abdominal:   Abdomen is soft, without tenderness or noted hepatosplenomegaly Musculoskeletal:        General: No swelling or edema  Lymphadenopathy:     Cervical: No cervical adenopathy.  Skin:    General: Skin is warm and dry without noticeable rash. Neurological:     General: No focal deficit present.  Psychiatric:        Mood and Affect: Mood, behavior and cognition normal   Past Medical History:  Diagnosis Date   Anemia    Chronic kidney disease    stone stone   GERD (gastroesophageal reflux disease)    Hypertension    KNEE SPRAIN, MEDIAL COLLATERAL LIGAMENT 03/23/2007   Qualifier: Diagnosis of   By: FAUSTINO MD, JONATHAN         Malnutrition of moderate degree 03/24/2018   Past Surgical History:  Procedure Laterality Date   COLONOSCOPY     ESOPHAGOGASTRODUODENOSCOPY N/A 06/17/2023  Procedure: ESOPHAGOGASTRODUODENOSCOPY (EGD);  Surgeon: Toledo, Ladell POUR, MD;  Location: [facility] ENDOSCOPY;  Service: Gastroenterology;  Laterality: N/A;   HERNIA REPAIR  1977   68 yrs old multiple times since   KNEE ARTHROSCOPY Left 2007   Family Status  Relation Name Status   Mother  Deceased   Father  Deceased   MGM  Deceased   MGF  Deceased   Cousin  (Not Specified)   Neg Hx  (Not Specified)  No partnership data on file   Family History  Problem Relation Age of Onset   Hepatitis B Father    Cirrhosis Father    Pancreatic cancer Maternal Grandmother    Colon cancer Maternal Grandfather 92   Colon cancer Cousin 38   Esophageal cancer Neg Hx    Stomach cancer Neg Hx     Colon polyps Neg Hx    Rectal cancer Neg Hx    Social History   Socioeconomic History   Marital status: Single    Spouse name: Not on file   Number of children: Not on file   Years of education: Not on file   Highest education level: Some college, no degree  Occupational History   Not on file  Tobacco Use   Smoking status: Never   Smokeless tobacco: Never  Vaping Use   Vaping status: Never Used  Substance and Sexual Activity   Alcohol use: Yes    Comment: occasionally   Drug use: No   Sexual activity: Not on file  Other Topics Concern   Not on file  Social History Narrative   Not on file   Social Drivers of Health   Financial Resource Strain: Low Risk  (06/19/2024)   Overall Financial Resource Strain (CARDIA)    Difficulty of Paying Living Expenses: Not very hard  Food Insecurity: No Food Insecurity (06/19/2024)   Hunger Vital Sign    Worried About Running Out of Food in the Last Year: Never true    Ran Out of Food in the Last Year: Never true  Transportation Needs: No Transportation Needs (06/19/2024)   PRAPARE - Administrator, Civil Service (Medical): No    Lack of Transportation (Non-Medical): No  Physical Activity: Insufficiently Active (06/19/2024)   Exercise Vital Sign    Days of Exercise per Week: 4 days    Minutes of Exercise per Session: 30 min  Stress: Stress Concern Present (06/19/2024)   [employer] of Occupational Health - Occupational Stress Questionnaire    Feeling of Stress: To some extent  Social Connections: Moderately Integrated (06/19/2024)   Social Connection and Isolation Panel    Frequency of Communication with Friends and Family: Three times a week    Frequency of Social Gatherings with Friends and Family: More than three times a week    Attends Religious Services: More than 4 times per year    Active Member of Clubs or Organizations: No    Attends Engineer, structural: Not on file    Marital Status: Living with  partner   Outpatient Medications Prior to Visit  Medication Sig   lisinopril  (ZESTRIL ) 10 MG tablet Take 1 tablet (10 mg total) by mouth daily.   meloxicam (MOBIC) 15 MG tablet TAKE 1 TABLET BY MOUTH ONCE DAILY FOR 14DAYS   Multiple Vitamins-Minerals (MULTIVITAMIN ADULTS PO) Take by mouth.   olopatadine (PATADAY) 0.1 % ophthalmic solution Place 1 drop into both eyes 2 (two) times daily.   omeprazole (PRILOSEC) 20 MG capsule Take 20  mg by mouth 2 (two) times daily before a meal.   OVER THE COUNTER MEDICATION daily at 12 noon. Stress B   finasteride  (PROSCAR ) 5 MG tablet Take 5 mg by mouth daily. (Patient not taking: Reported on 06/23/2024)   [DISCONTINUED] amLODipine  (NORVASC ) 5 MG tablet Take 5 mg by mouth daily. (Patient not taking: Reported on 06/23/2024)   [DISCONTINUED] amLODipine  (NORVASC ) 5 MG tablet Take 5 mg by mouth daily. (Patient not taking: Reported on 06/23/2024)   [DISCONTINUED] sildenafil (VIAGRA) 100 MG tablet Take 100 mg by mouth daily as needed for erectile dysfunction.   [DISCONTINUED] traMADol  (ULTRAM ) 50 MG tablet Take 100 mg by mouth every 8 (eight) hours. PRN   No facility-administered medications prior to visit.   No Known Allergies  Immunization History  Administered Date(s) Administered   Influenza,inj,Quad PF,6+ Mos 08/08/2020   PFIZER Comirnaty(Gray Top)Covid-19 Tri-Sucrose Vaccine 11/05/2019, 11/26/2019    Health Maintenance  Topic Date Due   Medicare Annual Wellness (AWV)  Never done   Hepatitis C Screening  Never done   DTaP/Tdap/Td (1 - Tdap) Never done   Zoster Vaccines- Shingrix (1 of 2) Never done   Pneumococcal Vaccine: 50+ Years (1 of 1 - PCV) Never done   COVID-19 Vaccine (3 - Pfizer risk series) 12/24/2019   Influenza Vaccine  04/22/2024   Colonoscopy  01/20/2029   HPV VACCINES  Aged Out   Meningococcal B Vaccine  Aged Out    Patient Care Team: Corlis Honor BROCKS, MD as PCP - General (Internal Medicine)  Depression Screen     No data to  display           Parris DELENA Juneau, MD  [facility] [phone] (phone) [phone] (fax)  [facility]

## 2024-06-27 LAB — LIPOPROTEIN A (LPA): Lipoprotein (a): <10 nmol/L (ref <75)

## 2024-06-27 LAB — CBC WITH DIFFERENTIAL/PLATELET
Absolute Lymphocytes: 2044 {cells}/uL (ref 850–3900)
Absolute Monocytes: 515 {cells}/uL (ref 200–950)
Basophils Absolute: 28 {cells}/uL (ref 0–200)
Basophils Relative: 0.5 %
Eosinophils Absolute: 157 {cells}/uL (ref 15–500)
Eosinophils Relative: 2.8 %
HCT: 42.4 % (ref 38.5–50.0)
Hemoglobin: 14.1 g/dL (ref 13.2–17.1)
MCH: 31 pg (ref 27.0–33.0)
MCHC: 33.3 g/dL (ref 32.0–36.0)
MCV: 93.2 fL (ref 80.0–100.0)
MPV: 11.6 fL (ref 7.5–12.5)
Monocytes Relative: 9.2 %
Neutro Abs: 2856 {cells}/uL (ref 1500–7800)
Neutrophils Relative %: 51 %
Platelets: 195 Thousand/uL (ref 140–400)
RBC: 4.55 Million/uL (ref 4.20–5.80)
RDW: 12.9 % (ref 11.0–15.0)
Total Lymphocyte: 36.5 %
WBC: 5.6 Thousand/uL (ref 3.8–10.8)

## 2024-06-27 LAB — COMPREHENSIVE METABOLIC PANEL WITH GFR
AG Ratio: 1.9 (calc) (ref 1.0–2.5)
ALT: 18 U/L (ref 9–46)
AST: 20 U/L (ref 10–35)
Albumin: 4.5 g/dL (ref 3.6–5.1)
Alkaline phosphatase (APISO): 55 U/L (ref 35–144)
BUN: 9 mg/dL (ref 7–25)
CO2: 28 mmol/L (ref 20–32)
Calcium: 9.7 mg/dL (ref 8.6–10.3)
Chloride: 103 mmol/L (ref 98–110)
Creat: 0.73 mg/dL (ref 0.70–1.35)
Globulin: 2.4 g/dL (ref 1.9–3.7)
Glucose, Bld: 104 mg/dL — ABNORMAL HIGH (ref 65–99)
Potassium: 4.8 mmol/L (ref 3.5–5.3)
Sodium: 140 mmol/L (ref 135–146)
Total Bilirubin: 0.4 mg/dL (ref 0.2–1.2)
Total Protein: 6.9 g/dL (ref 6.1–8.1)
eGFR: 99 mL/min/1.73m2 (ref >=60)

## 2024-06-27 LAB — URINALYSIS, ROUTINE W REFLEX MICROSCOPIC
Bilirubin Urine: NEGATIVE
Glucose, UA: NEGATIVE
Hgb urine dipstick: NEGATIVE
Ketones, ur: NEGATIVE
Leukocytes,Ua: NEGATIVE
Nitrite: NEGATIVE
Protein, ur: NEGATIVE
Specific Gravity, Urine: 1.018 (ref 1.001–1.035)
pH: 7 (ref 5.0–8.0)

## 2024-06-27 LAB — HEMOGLOBIN A1C
Hgb A1c MFr Bld: 5.5 % (ref <5.7)
Mean Plasma Glucose: 111 mg/dL
eAG (mmol/L): 6.2 mmol/L

## 2024-06-27 LAB — LIPID PANEL
Cholesterol: 207 mg/dL — ABNORMAL HIGH (ref <200)
HDL: 49 mg/dL (ref >=40)
LDL Cholesterol (Calc): 137 mg/dL — ABNORMAL HIGH
Non-HDL Cholesterol (Calc): 158 mg/dL — ABNORMAL HIGH (ref <130)
Total CHOL/HDL Ratio: 4.2 (calc) (ref <5.0)
Triglycerides: 100 mg/dL (ref <150)

## 2024-06-27 LAB — PSA: PSA: 2.3 ng/mL (ref <=4.00)

## 2024-06-29 ENCOUNTER — Ambulatory Visit (HOSPITAL_COMMUNITY)
Admission: RE | Admit: 2024-06-29 | Discharge: 2024-06-29 | Disposition: A | Discharge status: Home / Self Care | Source: Ambulatory Visit | Attending: Cardiology | Admitting: Cardiology

## 2024-06-29 DIAGNOSIS — R000 Tachycardia, unspecified: Secondary | ICD-10-CM | POA: Diagnosis present

## 2024-06-29 DIAGNOSIS — I10 Essential (primary) hypertension: Secondary | ICD-10-CM | POA: Insufficient documentation

## 2024-06-29 DIAGNOSIS — R079 Chest pain, unspecified: Secondary | ICD-10-CM | POA: Insufficient documentation

## 2024-06-29 DIAGNOSIS — K219 Gastro-esophageal reflux disease without esophagitis: Secondary | ICD-10-CM | POA: Insufficient documentation

## 2024-06-29 LAB — ECHOCARDIOGRAM COMPLETE
Area-P 1/2: 2.87 cm2
S' Lateral: 2.4 cm

## 2024-07-01 ENCOUNTER — Telehealth (HOSPITAL_COMMUNITY): Payer: Self-pay | Admitting: Emergency Medicine

## 2024-07-01 NOTE — Telephone Encounter (Signed)
 Attempted to call patient regarding upcoming cardiac CT appointment. Left message on voicemail with name and callback number Keith Alexandria RN Navigator Cardiac Imaging [facility] [phone] Office [phone] Cell

## 2024-07-04 ENCOUNTER — Ambulatory Visit (HOSPITAL_BASED_OUTPATIENT_CLINIC_OR_DEPARTMENT_OTHER)
Admission: RE | Admit: 2024-07-04 | Discharge: 2024-07-04 | Disposition: A | Discharge status: Home / Self Care | Source: Ambulatory Visit | Attending: Cardiology | Admitting: Cardiology

## 2024-07-04 ENCOUNTER — Ambulatory Visit (HOSPITAL_COMMUNITY)
Admission: RE | Admit: 2024-07-04 | Discharge: 2024-07-04 | Disposition: A | Discharge status: Home / Self Care | Source: Ambulatory Visit

## 2024-07-04 ENCOUNTER — Other Ambulatory Visit: Payer: Self-pay | Admitting: Cardiology

## 2024-07-04 DIAGNOSIS — I2510 Atherosclerotic heart disease of native coronary artery without angina pectoris: Secondary | ICD-10-CM | POA: Insufficient documentation

## 2024-07-04 DIAGNOSIS — Z136 Encounter for screening for cardiovascular disorders: Secondary | ICD-10-CM | POA: Insufficient documentation

## 2024-07-04 DIAGNOSIS — R079 Chest pain, unspecified: Secondary | ICD-10-CM | POA: Diagnosis not present

## 2024-07-04 DIAGNOSIS — I10 Essential (primary) hypertension: Secondary | ICD-10-CM | POA: Diagnosis not present

## 2024-07-04 DIAGNOSIS — R000 Tachycardia, unspecified: Secondary | ICD-10-CM | POA: Insufficient documentation

## 2024-07-04 DIAGNOSIS — I700 Atherosclerosis of aorta: Secondary | ICD-10-CM | POA: Insufficient documentation

## 2024-07-04 DIAGNOSIS — R931 Abnormal findings on diagnostic imaging of heart and coronary circulation: Secondary | ICD-10-CM

## 2024-07-04 DIAGNOSIS — K219 Gastro-esophageal reflux disease without esophagitis: Secondary | ICD-10-CM | POA: Diagnosis not present

## 2024-07-04 MED ORDER — NITROGLYCERIN 0.4 MG SL SUBL
0.8000 mg | SUBLINGUAL_TABLET | Freq: Once | SUBLINGUAL | Status: AC
  Administered 2024-07-04: 0.8 mg via SUBLINGUAL

## 2024-07-04 MED ORDER — METOPROLOL TARTRATE 5 MG/5ML IV SOLN
10.0000 mg | Freq: Once | INTRAVENOUS | Status: AC | PRN
  Administered 2024-07-04: 10 mg via INTRAVENOUS

## 2024-07-04 MED ORDER — DILTIAZEM HCL 25 MG/5ML IV SOLN: 10.0000 mg | INTRAVENOUS | Status: DC | PRN

## 2024-07-04 MED ORDER — IOHEXOL 350 MG/ML SOLN
100.0000 mL | Freq: Once | INTRAVENOUS | Status: AC | PRN
  Administered 2024-07-04: 100 mL via INTRAVENOUS

## 2024-07-05 ENCOUNTER — Ambulatory Visit
Admission: RE | Admit: 2024-07-05 | Discharge: 2024-07-05 | Disposition: A | Discharge status: Home / Self Care | Source: Ambulatory Visit

## 2024-07-05 DIAGNOSIS — R079 Chest pain, unspecified: Secondary | ICD-10-CM | POA: Diagnosis present

## 2024-07-05 DIAGNOSIS — I10 Essential (primary) hypertension: Secondary | ICD-10-CM | POA: Diagnosis present

## 2024-07-05 DIAGNOSIS — K219 Gastro-esophageal reflux disease without esophagitis: Secondary | ICD-10-CM | POA: Insufficient documentation

## 2024-07-05 DIAGNOSIS — R000 Tachycardia, unspecified: Secondary | ICD-10-CM | POA: Diagnosis present

## 2024-07-14 ENCOUNTER — Ambulatory Visit (INDEPENDENT_AMBULATORY_CARE_PROVIDER_SITE_OTHER)

## 2024-07-14 ENCOUNTER — Other Ambulatory Visit: Payer: Self-pay

## 2024-07-14 ENCOUNTER — Telehealth: Payer: Self-pay

## 2024-07-14 VITALS — BP 180/100 | HR 79 | Ht 67.0 in | Wt 169.6 lb

## 2024-07-14 DIAGNOSIS — I771 Stricture of artery: Secondary | ICD-10-CM | POA: Diagnosis not present

## 2024-07-14 DIAGNOSIS — I2510 Atherosclerotic heart disease of native coronary artery without angina pectoris: Secondary | ICD-10-CM | POA: Insufficient documentation

## 2024-07-14 DIAGNOSIS — R0602 Shortness of breath: Secondary | ICD-10-CM

## 2024-07-14 DIAGNOSIS — E785 Hyperlipidemia, unspecified: Secondary | ICD-10-CM | POA: Insufficient documentation

## 2024-07-14 DIAGNOSIS — E782 Mixed hyperlipidemia: Secondary | ICD-10-CM

## 2024-07-14 DIAGNOSIS — I10 Essential (primary) hypertension: Secondary | ICD-10-CM

## 2024-07-14 MED ORDER — BUDESONIDE-FORMOTEROL FUMARATE 160-4.5 MCG/ACT IN AERO: 2.0000 | INHALATION_SPRAY | Freq: Two times a day (BID) | RESPIRATORY_TRACT | 3 refills | Status: DC

## 2024-07-14 MED ORDER — ROSUVASTATIN CALCIUM 20 MG PO TABS: 20.0000 mg | ORAL_TABLET | Freq: Every day | ORAL | 3 refills | Status: DC

## 2024-07-14 MED ORDER — SPIRIVA RESPIMAT 2.5 MCG/ACT IN AERS: 2.0000 | INHALATION_SPRAY | Freq: Every day | RESPIRATORY_TRACT | 2 refills | Status: AC

## 2024-07-14 MED ORDER — AMLODIPINE BESYLATE 10 MG PO TABS: 10.0000 mg | ORAL_TABLET | Freq: Every day | ORAL | 3 refills | Status: AC

## 2024-07-14 NOTE — Telephone Encounter (Signed)
 Didn't contact pharmacy symbicort not covered by insurance

## 2024-07-14 NOTE — Telephone Encounter (Signed)
 Copied from CRM [id number]. Topic: Clinical - Prescription Issue >> Jul 14, 2024 11:20 AM Selinda RAMAN wrote: Reason for CRM: Lorn with [facility] called in stating the patients prescription for budesonide-formoterol (SYMBICORT) 160-4.5 MCG/ACT inhaler was written wrong. She says after the asterisk it should not say 1 but rather it should say 10.2 . Please send in a new prescription with the correct numbers and if any questions call  [facility] - [place], [place] - [address]  Phone: [phone] Fax: [phone]   Please assist patient further

## 2024-07-14 NOTE — Progress Notes (Signed)
 Progress Note  Physician: Parris DELENA Juneau, MD   HPI: Keith Ramos is a 68 y.o. male presenting on 07/14/2024 for Follow-up .  Discussed the use of AI scribe software for clinical note transcription with the patient, who gave verbal consent to proceed.  History of Present Illness   Keith Ramos is a 68 year old male with coronary artery disease and emphysematous changes who presents with shortness of breath and chest discomfort.  Dyspnea - Persistent shortness of breath described as a burning sensation, similar to that experienced during long runs in cold weather - Triggered by minimal exertion, such as walking to the end of the driveway - No severe chest pain or inability to breathe - History of exposure to sheetrock dust for the past four to five years - No significant smoking history  Chest discomfort - Episodes of chest discomfort characterized by heaviness or tightness in the sternum area - Primarily occurs at night, interfering with sleep and limiting it to approximately three hours - Associated with indigestion-like symptoms - possible anxiety component  Coronary artery disease - Recent CCTA demonstrated plaque in coronary arteries - Currently taking 30 mg lisinopril  and baby aspirin - Previously on amlodipine , but has run out of medication - Family history of heart disease - High-stress lifestyle acknowledged as a contributing factor  IMPRESSION: 1. Coronary calcium score of 1409. This was 34 percentile for age-, sex, and race-matched controls.   2. Aortic atherosclerosis.   3. Normal coronary origin with left dominance.   4. Moderate (50-69) stenoses in the Lcx and D1; mild disease elsewhere as outlined.    Lower extremity vascular symptoms - History of varicose veins with prior sclerotherapy - Discoloration and discomfort in the right leg attributed to vascular issues  Gastrointestinal symptoms - History of heartburn, well-controlled with  Prilosec  Renal calculi - History of non-obstructive kidney stone, currently asymptomatic  Anxiety and sleep disturbance - Experiences anxiety, particularly at night - Sleep limited to approximately three hours due to nocturnal chest discomfort and anxiety         Medical history:  Relevant past medical, surgical, family and social history reviewed and updated as indicated. Interim medical history since our last visit reviewed.  Allergies and medications reviewed and updated.   ROS: Negative unless specifically indicated above in HPI.    Current Outpatient Medications:    amLODipine  (NORVASC ) 10 MG tablet, Take 1 tablet (10 mg total) by mouth daily., Disp: 90 tablet, Rfl: 3   aspirin 81 MG chewable tablet, Chew 81 mg by mouth daily., Disp: , Rfl:    budesonide-formoterol (SYMBICORT) 160-4.5 MCG/ACT inhaler, Inhale 2 puffs into the lungs 2 (two) times daily., Disp: 1 each, Rfl: 3   lisinopril  (ZESTRIL ) 10 MG tablet, Take 1 tablet (10 mg total) by mouth daily., Disp: , Rfl:    meloxicam (MOBIC) 15 MG tablet, TAKE 1 TABLET BY MOUTH ONCE DAILY FOR 14DAYS, Disp: , Rfl:    Multiple Vitamins-Minerals (MULTIVITAMIN ADULTS PO), Take by mouth., Disp: , Rfl:    omeprazole (PRILOSEC) 20 MG capsule, Take 20 mg by mouth 2 (two) times daily before a meal., Disp: , Rfl:    finasteride  (PROSCAR ) 5 MG tablet, Take 5 mg by mouth daily. (Patient not taking: Reported on 06/23/2024), Disp: , Rfl:    olopatadine (PATADAY) 0.1 % ophthalmic solution, Place 1 drop into both eyes 2 (two) times daily. (Patient not taking: Reported on 07/14/2024),  Disp: , Rfl:    OVER THE COUNTER MEDICATION, daily at 12 noon. Stress B (Patient not taking: Reported on 07/14/2024), Disp: , Rfl:        Objective:     BP (!) 180/100   Pulse 79   Ht 5' 7 (1.702 m)   Wt 169 lb 9.6 oz (76.9 kg)   SpO2 99%   BMI 26.56 kg/m   Wt Readings from Last 3 Encounters:  07/14/24 169 lb 9.6 oz (76.9 kg)  06/23/24 169 lb 6.4 oz  (76.8 kg)  03/10/24 174 lb (78.9 kg)    Physical Exam  Physical Exam Vitals reviewed.  Constitutional:      Appearance: Normal appearance. Well-developed with normal weight.  Cardiovascular:     Rate and Rhythm: Normal rate and regular rhythm. Normal heart sounds. Normal peripheral pulses Pulmonary:     Normal breath sounds with normal effort Skin:    General: Skin is warm and dry without noticeable rash. Neurological:     General: No focal deficit present.  Psychiatric:        Mood and Affect: Mood, behavior and cognition normal      Assessment & Plan:   Encounter Diagnoses  Name Primary?   Shortness of breath Yes   Coronary artery disease involving native coronary artery of native heart, unspecified whether angina present    Subclavian arterial stenosis    Essential hypertension    Mixed hyperlipidemia     Orders Placed This Encounter  Procedures   Ambulatory referral to Pulmonology     Assessment and Plan    Coronary artery disease with subclavian artery stenosis and hyperlipidemia Non-obstructive plaque in left anterior descending and left circumflex arteries.  LDL at 130 mg/dL, goal <29 mg/dL due to coronary artery disease. Risk of progression exists. - Prescribe rosuvastatin 20 mg - Prescribe baby aspirin 81 mg daily. - Monitor blood pressure regularly. - Should establish with cardiology for ongoing monitoring  - Query additional medical management going forward  Hypertension Current regimen of 30 mg lisinopril . Blood pressure goal <130/80 mmHg due to coronary artery disease. Elevated readings indicate need for additional medication. - Prescribe amlodipine  10 mg - Maintain blood pressure log for 2-3 weeks.  Emphysematous lung changes with shortness of breath Mild emphysematous changes and central bronchial thickening likely contribute to shortness of breath. Differential includes COPD due to non-smoking causes. Pulmonary function testing needed. - Refer to  pulmonology for pulmonary function testing. - Prescribe inhaler with steroid for trial use. - Encourage exercise as tolerated, monitor for chest pain.  Right non-obstructing kidney stone Non-obstructing kidney stone on the right side, currently asymptomatic. - Monitor for symptoms of obstruction or pain.      Subclavian stenosis - Seems asymptomatic.  Will discuss further  - carotid ultrasound with visualization of vertebral arteries appropriate    Fu in 2 weeks - ? Need for ABI?

## 2024-07-14 NOTE — Telephone Encounter (Signed)
 Copied from CRM #[id number]. Topic: Clinical - Prescription Issue >> Jul 14, 2024 12:06 PM Tobias CROME wrote: Reason for CRM: Lorn with [facility] calling to inform office the symbicort, brand and generic are not covered under patient's insurance. No suggestions given by system for what patient's insurance will cover.   Requesting new prescription for inhaler in patient's formulary.

## 2024-07-18 ENCOUNTER — Ambulatory Visit
Admission: RE | Admit: 2024-07-18 | Discharge: 2024-07-18 | Disposition: A | Discharge status: Home / Self Care | Source: Ambulatory Visit

## 2024-07-18 DIAGNOSIS — I2510 Atherosclerotic heart disease of native coronary artery without angina pectoris: Secondary | ICD-10-CM | POA: Insufficient documentation

## 2024-07-18 DIAGNOSIS — R0602 Shortness of breath: Secondary | ICD-10-CM | POA: Insufficient documentation

## 2024-07-18 DIAGNOSIS — I10 Essential (primary) hypertension: Secondary | ICD-10-CM | POA: Insufficient documentation

## 2024-07-18 DIAGNOSIS — E782 Mixed hyperlipidemia: Secondary | ICD-10-CM | POA: Diagnosis present

## 2024-07-18 DIAGNOSIS — I771 Stricture of artery: Secondary | ICD-10-CM | POA: Diagnosis present

## 2024-08-04 ENCOUNTER — Ambulatory Visit (INDEPENDENT_AMBULATORY_CARE_PROVIDER_SITE_OTHER)

## 2024-08-04 VITALS — BP 161/79 | HR 83 | Ht 67.0 in | Wt 174.0 lb

## 2024-08-04 DIAGNOSIS — R0602 Shortness of breath: Secondary | ICD-10-CM | POA: Diagnosis not present

## 2024-08-04 DIAGNOSIS — I2510 Atherosclerotic heart disease of native coronary artery without angina pectoris: Secondary | ICD-10-CM | POA: Diagnosis not present

## 2024-08-04 DIAGNOSIS — I10 Essential (primary) hypertension: Secondary | ICD-10-CM | POA: Diagnosis not present

## 2024-08-04 NOTE — Progress Notes (Signed)
 Progress Note  Physician: Parris DELENA Juneau, MD   HPI: Keith Ramos is a 68 y.o. male presenting on 08/04/2024 for Follow-up .  Discussed the use of AI scribe software for clinical note transcription with the patient, who gave verbal consent to proceed.  History of Present Illness   Keith Ramos is a 68 year old male with hypertension and coronary artery disease who presents for blood pressure management and follow-up on cardiovascular health.  Hypertension and blood pressure variability - Home blood pressure monitoring shows average readings of 140/70 mmHg in the morning and higher readings at night - Lowest recorded blood pressure is 110/70 mmHg - Currently taking 30 mg of lisinopril  daily  Coronary artery disease risk and cardiovascular evaluation - History of coronary artery disease - Completed carotid ultrasound, CT, echocardiogram, and ultrasound - Has not yet seen a cardiologist - CCTA showing non-obstructive lesions.  - Significant family history of coronary artery disease with multiple male relatives experiencing myocardial infarctions in their 30s  - Carotid US  with <50% plaque bilaterally  Lifestyle modifications - Dietary changes include switching from breakfast sandwiches to oatmeal and yogurt, and eating smaller, more frequent meals - Avoids eating after 6 PM - Engages in 3-4 miles of brisk walking daily - Has started going to the gym for walking exercises, no weight training  Nocturnal dyspnea and sleep disturbance - No nocturnal wheezing or coughing - Wakes up 7-8 times per night with sensation of not getting enough oxygen and dry mouth, Uncertain about snoring - No evaluation for sleep apnea in the past     Medical history:  Relevant past medical, surgical, family and social history reviewed and updated as indicated. Interim medical history since our last visit reviewed.  Allergies and medications reviewed and updated.   ROS: Negative unless  specifically indicated above in HPI.    Current Outpatient Medications:    amLODipine  (NORVASC ) 10 MG tablet, Take 1 tablet (10 mg total) by mouth daily., Disp: 90 tablet, Rfl: 3   aspirin 81 MG chewable tablet, Chew 81 mg by mouth daily., Disp: , Rfl:    budesonide-formoterol (SYMBICORT) 160-4.5 MCG/ACT inhaler, Inhale 2 puffs into the lungs 2 (two) times daily., Disp: 10.2 g, Rfl: 3   lisinopril  (ZESTRIL ) 30 MG tablet, Take 30 mg by mouth daily., Disp: , Rfl:    meloxicam (MOBIC) 15 MG tablet, TAKE 1 TABLET BY MOUTH ONCE DAILY FOR 14DAYS, Disp: , Rfl:    Multiple Vitamins-Minerals (MULTIVITAMIN ADULTS PO), Take by mouth., Disp: , Rfl:    rosuvastatin (CRESTOR) 20 MG tablet, Take 1 tablet (20 mg total) by mouth daily., Disp: 90 tablet, Rfl: 3   Tiotropium Bromide (SPIRIVA RESPIMAT) 2.5 MCG/ACT AERS, Inhale 2 puffs into the lungs daily., Disp: 4 g, Rfl: 2   omeprazole (PRILOSEC) 20 MG capsule, Take 20 mg by mouth 2 (two) times daily before a meal., Disp: , Rfl:        Objective:     BP (!) 161/79   Pulse 83   Ht 5' 7 (1.702 m)   Wt 174 lb (78.9 kg)   SpO2 98%   BMI 27.25 kg/m   Wt Readings from Last 3 Encounters:  08/04/24 174 lb (78.9 kg)  07/14/24 169 lb 9.6 oz (76.9 kg)  06/23/24 169 lb 6.4 oz (76.8 kg)    Physical Exam  Physical Exam Vitals reviewed.  Constitutional:      Appearance: Normal  appearance. Well-developed with normal weight.  Cardiovascular:     Rate and Rhythm: Normal rate and regular rhythm. Normal heart sounds. Normal peripheral pulses Pulmonary:     Normal breath sounds with normal effort Skin:    General: Skin is warm and dry without noticeable rash. Neurological:     General: No focal deficit present.  Psychiatric:        Mood and Affect: Mood, behavior and cognition normal      Assessment & Plan:   Encounter Diagnoses  Name Primary?   Waking at night short of breath Yes   Essential hypertension    Shortness of breath    Coronary artery  disease involving native coronary artery of native heart, unspecified whether angina present     Orders Placed This Encounter  Procedures   Home sleep test     Assessment and Plan    Essential hypertension Blood pressure averages 130/70s, higher in the morning. Considered evening dosing to stabilize morning readings. - Continue lisinopril  30 mg once daily.  Coronary artery disease with non-obstructive plaque Non-obstructive coronary plaque, <50% carotid stenosis. Discussed genetic predisposition and importance of controlling blood pressure and cholesterol. - Continue statin and aspirin. - Follow up with cardiology.  Suspected obstructive sleep apnea Discussed impact of sleep apnea on cardiovascular health and blood pressure. Explained risks of untreated sleep apnea. - Home sleep study ordered  Shortness of breath Improved with Spiriva. No nocturnal wheezing or coughing. Discussed potential obstructive lung component. - Continue Spiriva inhaler and keep scheduled appt with Pulm       Will f/u in January otherwise.

## 2024-08-07 DIAGNOSIS — I739 Peripheral vascular disease, unspecified: Secondary | ICD-10-CM | POA: Insufficient documentation

## 2024-08-07 DIAGNOSIS — I6529 Occlusion and stenosis of unspecified carotid artery: Secondary | ICD-10-CM | POA: Insufficient documentation

## 2024-08-07 NOTE — Progress Notes (Unsigned)
 Cardiology Office Note  Date:  08/08/2024   ID:  ZAFAR DEBROSSE, DOB 22-Jan-1956, MRN [id number]  PCP:  Everlene Parris LABOR, MD   Chief Complaint  Patient presents with   New Patient (Initial Visit)    Ref by Dr. Clarissa Zafirov for shortness of breath & CAD and discuss CTA. Patient c/o chest pressure, shortness of breath with walking about 300 yards and bilateral LE edema.     HPI:  Keith Ramos is a 68 y.o. male with past medical history of: Past Medical History:  Diagnosis Date   Anemia    Chronic kidney disease    stone stone   Epididymitis, right 07/30/2022   GERD (gastroesophageal reflux disease)    Hypertension    KNEE SPRAIN, MEDIAL COLLATERAL LIGAMENT 03/23/2007   Qualifier: Diagnosis of   By: FAUSTINO MD, JONATHAN         Malnutrition of moderate degree 03/24/2018  Calcium score 1400  Cardiac CTA with moderate left circumflex and diagonal #1 disease Who presents by referral from Zafirov for coronary artery disease, shortness of breath, PAD, subclavian artery stenosis, hypertension, hyperlipidemia, coronary artery disease  Used to run until 7 years ago Stopped for vein issues in lower extremity Leg cramps, History of vein stripping  In terms of cardiac risk factors Non smoker Nondiabteic Elevated cholesterol, started crestor 20 mg daily  Labs reviewed Total chol 207, LDL 137  Developed SOB on ambulation, leading to imaging  Echocardiogram June 29, 2024 EF 60 to 65%  Cardiac CTA July 04, 2024 calcium score 1400 Moderate stenosis in the left circumflex, D1 FFR with no significant flow-limiting option  Carotid ultrasound July 18, 2024 Less than 50% bilaterally  Currently reports exercising doing long walks, denies chest pain on exertion concerning for angina do not know weird  EKG personally reviewed by myself on todays visit EKG Interpretation Date/Time:  Monday August 08 2024 15:49:09 EST Ventricular Rate:  87 PR Interval:  168 QRS  Duration:  80 QT Interval:  346 QTC Calculation: 416 R Axis:   -4  Text Interpretation: Normal sinus rhythm Normal ECG When compared with ECG of 15-Jun-2023 04:54, rate has improved Confirmed by Perla Lye [phone]) on 08/08/2024 4:15:14 PM    PMH:   has a past medical history of Anemia, Chronic kidney disease, Epididymitis, right (07/30/2022), GERD (gastroesophageal reflux disease), Hypertension, KNEE SPRAIN, MEDIAL COLLATERAL LIGAMENT (03/23/2007), and Malnutrition of moderate degree (03/24/2018).   PSH:    Past Surgical History:  Procedure Laterality Date   COLONOSCOPY     ESOPHAGOGASTRODUODENOSCOPY N/A 06/17/2023   Procedure: ESOPHAGOGASTRODUODENOSCOPY (EGD);  Surgeon: Toledo, Ladell POUR, MD;  Location: [facility] ENDOSCOPY;  Service: Gastroenterology;  Laterality: N/A;   HERNIA REPAIR  1977   68 yrs old multiple times since   KNEE ARTHROSCOPY Left 2007    Current Outpatient Medications  Medication Sig Dispense Refill   amLODipine  (NORVASC ) 10 MG tablet Take 1 tablet (10 mg total) by mouth daily. 90 tablet 3   aspirin 81 MG chewable tablet Chew 81 mg by mouth daily.     budesonide-formoterol (SYMBICORT) 160-4.5 MCG/ACT inhaler Inhale 2 puffs into the lungs 2 (two) times daily. 10.2 g 3   lisinopril  (ZESTRIL ) 30 MG tablet Take 30 mg by mouth daily.     meloxicam (MOBIC) 15 MG tablet TAKE 1 TABLET BY MOUTH ONCE DAILY FOR 14DAYS     Multiple Vitamins-Minerals (MULTIVITAMIN ADULTS PO) Take by mouth.     omeprazole (PRILOSEC) 20 MG capsule Take 20  mg by mouth 2 (two) times daily before a meal.     rosuvastatin (CRESTOR) 20 MG tablet Take 1 tablet (20 mg total) by mouth daily. 90 tablet 3   Tiotropium Bromide (SPIRIVA RESPIMAT) 2.5 MCG/ACT AERS Inhale 2 puffs into the lungs daily. 4 g 2   No current facility-administered medications for this visit.    Allergies:   Patient has no known allergies.   Social History:  The patient  reports that he has never smoked. He has never used  smokeless tobacco. He reports current alcohol use. He reports that he does not use drugs.   Family History:   family history includes Cirrhosis in his father; Colon cancer (age of onset: 67) in his cousin; Colon cancer (age of onset: 48) in his maternal grandfather; Hepatitis B in his father; Pancreatic cancer in his maternal grandmother.    Review of Systems: Review of Systems  Constitutional: Negative.   HENT: Negative.    Respiratory: Negative.    Cardiovascular: Negative.   Gastrointestinal: Negative.   Musculoskeletal: Negative.   Neurological: Negative.   Psychiatric/Behavioral: Negative.    All other systems reviewed and are negative.   PHYSICAL EXAM: VS:  BP (!) 142/78 (BP Location: Right Arm, Patient Position: Sitting, Cuff Size: Normal)   Pulse 87   Ht 5' 7 (1.702 m)   Wt 174 lb 4 oz (79 kg)   SpO2 98%   BMI 27.29 kg/m  , BMI Body mass index is 27.29 kg/m. GEN: Well nourished, well developed, in no acute distress HEENT: normal Neck: no JVD, carotid bruits, or masses Cardiac: RRR; no murmurs, rubs, or gallops,no edema  Respiratory:  clear to auscultation bilaterally, normal work of breathing GI: soft, nontender, nondistended, + BS MS: no deformity or atrophy Skin: warm and dry, no rash Neuro:  Strength and sensation are intact Psych: euthymic mood, full affect   Recent Labs: 06/23/2024: ALT 18; BUN 9; Creat 0.73; Hemoglobin 14.1; Platelets 195; Potassium 4.8; Sodium 140    Lipid Panel Lab Results  Component Value Date   CHOL 207 (H) 06/23/2024   HDL 49 06/23/2024   LDLCALC 137 (H) 06/23/2024   TRIG 100 06/23/2024      Wt Readings from Last 3 Encounters:  08/08/24 174 lb 4 oz (79 kg)  08/04/24 174 lb (78.9 kg)  07/14/24 169 lb 9.6 oz (76.9 kg)     ASSESSMENT AND PLAN:  Problem List Items Addressed This Visit       Cardiology Problems   PAD (peripheral artery disease)   Relevant Orders   EKG 12-Lead (Completed)   Carotid artery stenosis    Coronary artery disease - Primary   Relevant Orders   EKG 12-Lead (Completed)   Subclavian arterial stenosis   Hyperlipidemia   Essential hypertension   Relevant Orders   EKG 12-Lead (Completed)   Coronary artery disease with stable angina Moderate disease in left circumflex and diagonal branch FFR negative for severe stenosis Denies anginal symptoms No indication for cardiac catheterization at this time unless he develops anginal symptoms with exertion -Continue aggressive cholesterol management  Hyperlipidemia - Goal LDL less than 55 Possible myalgias on Crestor 20 If he does need to decrease the dosing, we may need to add Zetia 10 mg daily  Essential hypertension Blood pressure high end of the range, reports it is well-controlled at home No changes made   Signed, Velinda Lunger, M.D., Ph.D. [facility] [place], [place] [phone]

## 2024-08-08 ENCOUNTER — Encounter: Payer: Self-pay | Admitting: Cardiovascular Disease

## 2024-08-08 ENCOUNTER — Ambulatory Visit: Attending: Cardiovascular Disease | Admitting: Cardiovascular Disease

## 2024-08-08 VITALS — BP 142/78 | HR 87 | Ht 67.0 in | Wt 174.2 lb

## 2024-08-08 DIAGNOSIS — I6523 Occlusion and stenosis of bilateral carotid arteries: Secondary | ICD-10-CM

## 2024-08-08 DIAGNOSIS — I25118 Atherosclerotic heart disease of native coronary artery with other forms of angina pectoris: Secondary | ICD-10-CM | POA: Diagnosis not present

## 2024-08-08 DIAGNOSIS — I739 Peripheral vascular disease, unspecified: Secondary | ICD-10-CM

## 2024-08-08 DIAGNOSIS — I10 Essential (primary) hypertension: Secondary | ICD-10-CM

## 2024-08-08 DIAGNOSIS — I771 Stricture of artery: Secondary | ICD-10-CM | POA: Diagnosis not present

## 2024-08-08 DIAGNOSIS — E782 Mixed hyperlipidemia: Secondary | ICD-10-CM

## 2024-08-08 NOTE — Patient Instructions (Addendum)
 Read about scotoma, visual disturbance  Goal LDL <55  Medication Instructions:  No changes  If you need a refill on your cardiac medications before your next appointment, please call your pharmacy.   Lab work: No new labs needed  Testing/Procedures: No new testing needed  Follow-Up: At [facility], you and your health needs are our priority.  As part of our continuing mission to provide you with exceptional heart care, we have created designated Provider Care Teams.  These Care Teams include your primary Cardiologist (physician) and Advanced Practice Providers (APPs -  Physician Assistants and Nurse Practitioners) who all work together to provide you with the care you need, when you need it.  You will need a follow up appointment in 12 months  Providers on your designated Care Team:   Lonni Meager, NP Bernardino Bring, PA-C Cadence [place], [place]  COVID-19 Vaccine Information can be found at: [email or web address] For questions related to vaccine distribution or appointments, please email [email or web address] or call [phone].

## 2024-08-16 ENCOUNTER — Ambulatory Visit: Payer: Self-pay

## 2024-08-16 NOTE — Telephone Encounter (Signed)
 FYI Only or Action Required?: Action required by provider: clinical question for provider.  Patient was last seen in primary care on 08/04/2024 by Zafirov, Clarissa A, MD.  Called Nurse Triage reporting Leg Swelling.  Symptoms began several days ago.  Interventions attempted: Nothing.  Symptoms are: stable.  Triage Disposition: See PCP When Office is Open (Within 3 Days)  Patient/caregiver understands and will follow disposition?: No, refuses disposition Reason for Disposition  [1] MILD swelling of both ankles (i.e., pedal edema) AND [2] new-onset or getting worse  Answer Assessment - Initial Assessment Questions Patient states only medication added was Rosuvastatin  and finds it coincidental that legs started swelling after that. Cardiologist advised waiting a few days or a week and seeing if body adjusts. Monitors BP 3x daily, takes Lisinopril  and Amlodipine . Average is 130/62. Patient is concerned because he leaves to go out of the country on Friday and does not want to deal with leg swelling and thinks the Rosuvastatin  needs to be. Please advise.   1. ONSET: When did the swelling start? (e.g., minutes, hours, days)     3 days   2. LOCATION: What part of the leg is swollen?  Are both legs swollen or just one leg?     Both legs, mainly the right. Feels like its the whole leg  3. SEVERITY: How bad is the swelling? (e.g., localized; mild, moderate, severe)     Socks are making indentation  4. REDNESS: Is there redness or signs of infection?     Denies  5. PAIN: Is the swelling painful to touch? If Yes, ask: How painful is it?   (Scale 1-10; mild, moderate or severe)     Discomfort, feeling stiff  6. FEVER: Do you have a fever? If Yes, ask: What is it, how was it measured, and when did it start?      Denies  7. CAUSE: What do you think is causing the leg swelling?     Thinks its Rosuvastatin   8. MEDICAL HISTORY: Do you have a history of blood clots (e.g.,  DVT), cancer, heart failure, kidney disease, or liver failure?     Denies  9. RECURRENT SYMPTOM: Have you had leg swelling before? If Yes, ask: When was the last time? What happened that time?     Varicose veins in right leg taken care of in April  10. OTHER SYMPTOMS: Do you have any other symptoms? (e.g., chest pain, difficulty breathing)       Denies  Protocols used: Leg Swelling and Edema-A-AH  Copied from CRM #[id number]. Topic: Clinical - Red Word Triage >> Aug 16, 2024 11:33 AM Amy B wrote: Red Word that prompted transfer to Nurse Triage: Swelling in legs

## 2024-08-30 ENCOUNTER — Ambulatory Visit: Admitting: Pulmonary Disease

## 2024-08-30 ENCOUNTER — Other Ambulatory Visit
Admission: RE | Admit: 2024-08-30 | Discharge: 2024-08-30 | Disposition: A | Source: Ambulatory Visit | Attending: Pulmonary Disease | Admitting: Pulmonary Disease

## 2024-08-30 ENCOUNTER — Encounter: Payer: Self-pay | Admitting: Pulmonary Disease

## 2024-08-30 VITALS — BP 124/70 | HR 88 | Temp 98.0°F | Ht 67.0 in | Wt 176.0 lb

## 2024-08-30 DIAGNOSIS — M791 Myalgia, unspecified site: Secondary | ICD-10-CM

## 2024-08-30 DIAGNOSIS — Z9189 Other specified personal risk factors, not elsewhere classified: Secondary | ICD-10-CM

## 2024-08-30 DIAGNOSIS — M25519 Pain in unspecified shoulder: Secondary | ICD-10-CM

## 2024-08-30 DIAGNOSIS — R0602 Shortness of breath: Secondary | ICD-10-CM

## 2024-08-30 LAB — CK: Total CK: 166 U/L (ref 49–397)

## 2024-08-30 LAB — CBC WITH DIFFERENTIAL/PLATELET
Abs Immature Granulocytes: 0.02 K/uL (ref 0.00–0.07)
Basophils Absolute: 0 K/uL (ref 0.0–0.1)
Basophils Relative: 1 %
Eosinophils Absolute: 0.1 K/uL (ref 0.0–0.5)
Eosinophils Relative: 2 %
HCT: 36.5 % — ABNORMAL LOW (ref 39.0–52.0)
Hemoglobin: 12.8 g/dL — ABNORMAL LOW (ref 13.0–17.0)
Immature Granulocytes: 0 %
Lymphocytes Relative: 30 %
Lymphs Abs: 1.7 K/uL (ref 0.7–4.0)
MCH: 30.4 pg (ref 26.0–34.0)
MCHC: 35.1 g/dL (ref 30.0–36.0)
MCV: 86.7 fL (ref 80.0–100.0)
Monocytes Absolute: 0.6 K/uL (ref 0.1–1.0)
Monocytes Relative: 11 %
Neutro Abs: 3.1 K/uL (ref 1.7–7.7)
Neutrophils Relative %: 56 %
Platelets: 231 K/uL (ref 150–400)
RBC: 4.21 MIL/uL — ABNORMAL LOW (ref 4.22–5.81)
RDW: 12.7 % (ref 11.5–15.5)
WBC: 5.6 K/uL (ref 4.0–10.5)
nRBC: 0 % (ref 0.0–0.2)

## 2024-08-30 LAB — NITRIC OXIDE: Nitric Oxide: 21

## 2024-08-30 LAB — SEDIMENTATION RATE: Sed Rate: 9 mm/h (ref 0–20)

## 2024-08-30 LAB — C-REACTIVE PROTEIN: CRP: 1.2 mg/dL — ABNORMAL HIGH (ref <1.0)

## 2024-08-30 MED ORDER — SPIRIVA RESPIMAT 2.5 MCG/ACT IN AERS: 2.0000 | INHALATION_SPRAY | Freq: Every day | RESPIRATORY_TRACT | 0 refills | Status: DC

## 2024-08-30 NOTE — Patient Instructions (Signed)
 VISIT SUMMARY:  You came in today because you have been experiencing shortness of breath for the past eight to nine months. We discussed your symptoms, including sleep disturbances and muscle pain, and reviewed your medical history and recent tests. We have made some adjustments to your treatment plan and ordered further tests to better understand your condition.  YOUR PLAN:  -CHRONIC OBSTRUCTIVE PULMONARY DISEASE (COPD): COPD is a chronic lung disease that makes it hard to breathe. You reported improvement in your symptoms with Spiriva , so we provided you with samples of the Spiriva  inhaler and advised you to return the malfunctioning inhaler to the pharmacist for a replacement. We also ordered breathing tests to further evaluate your lung function. Continue using the Spiriva  inhaler and use Albuterol as a rescue inhaler when needed.  -SUSPECTED SLEEP APNEA: Sleep apnea is a condition where your breathing repeatedly stops and starts during sleep. Due to your symptoms of gasping for air and dry mouth at night, a sleep study has been ordered but is still pending scheduling. We will await the results of the sleep study to make a definitive diagnosis.  -STATIN-INDUCED MYALGIA: Statin-induced myalgia is muscle pain caused by the use of statin medications like Crestor . You have been experiencing significant shoulder pain and occasional hip stiffness, which may be related to your use of Crestor . We have ordered blood work to screen for rheumatic diseases and will consider adjusting your statin therapy if your symptoms persist.  INSTRUCTIONS:  Please follow up with the pharmacist to replace your malfunctioning Spiriva  inhaler. Continue using the Spiriva  inhaler and Albuterol as directed. Schedule your sleep study as soon as possible. We will review the results of your blood work and sleep study at your next appointment. If your muscle pain continues, we may need to adjust your statin medication.

## 2024-08-30 NOTE — Progress Notes (Unsigned)
 Subjective:    Patient ID: Keith Ramos, male    DOB: Feb 07, 1956, 68 y.o.   MRN: [id number]  Patient Care Team: Everlene Parris LABOR, MD as PCP - General (Family Medicine)  Chief Complaint  Patient presents with   Consult    Shortness of breath on exertion.    BACKGROUND:   HPI    Review of Systems A 10 point review of systems was performed and it is as noted above otherwise negative.   Past Medical History:  Diagnosis Date   Anemia    Chronic kidney disease    stone stone   Epididymitis, right 07/30/2022   GERD (gastroesophageal reflux disease)    Hypertension    KNEE SPRAIN, MEDIAL COLLATERAL LIGAMENT 03/23/2007   Qualifier: Diagnosis of   By: FAUSTINO MD, JONATHAN         Malnutrition of moderate degree 03/24/2018    Past Surgical History:  Procedure Laterality Date   COLONOSCOPY     ESOPHAGOGASTRODUODENOSCOPY N/A 06/17/2023   Procedure: ESOPHAGOGASTRODUODENOSCOPY (EGD);  Surgeon: Toledo, Ladell POUR, MD;  Location: [facility] ENDOSCOPY;  Service: Gastroenterology;  Laterality: N/A;   HERNIA REPAIR  1977   68 yrs old multiple times since   KNEE ARTHROSCOPY Left 2007    Patient Active Problem List   Diagnosis Date Noted   PAD (peripheral artery disease) 08/07/2024   Carotid artery stenosis 08/07/2024   Coronary artery disease 07/14/2024   Subclavian arterial stenosis 07/14/2024   Hyperlipidemia 07/14/2024   Shortness of breath 06/23/2024   Osteoarthritis 06/23/2024   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   Essential hypertension 07/30/2022   BPH (benign prostatic hyperplasia) 07/30/2022   Sinus tachycardia 07/29/2022   COLONIC POLYPS 05/26/2007   DIVERTICULOSIS, COLON W/O HEM 05/26/2007   REFLUX, ESOPHAGEAL 04/28/2007    Family History  Problem Relation Age of Onset   Hepatitis B Father    Cirrhosis Father    Pancreatic cancer Maternal Grandmother    Colon cancer Maternal Grandfather 12   Colon cancer Cousin 38   Esophageal cancer Neg Hx    Stomach  cancer Neg Hx    Colon polyps Neg Hx    Rectal cancer Neg Hx     Social History   Tobacco Use   Smoking status: Never   Smokeless tobacco: Never  Substance Use Topics   Alcohol use: Yes    Comment: occasionally    No Known Allergies  Current Meds  Medication Sig   amLODipine  (NORVASC ) 10 MG tablet Take 1 tablet (10 mg total) by mouth daily.   aspirin 81 MG chewable tablet Chew 81 mg by mouth daily.   lisinopril  (ZESTRIL ) 30 MG tablet Take 30 mg by mouth daily.   meloxicam (MOBIC) 15 MG tablet TAKE 1 TABLET BY MOUTH ONCE DAILY FOR 14DAYS   Multiple Vitamins-Minerals (MULTIVITAMIN ADULTS PO) Take by mouth.   omeprazole (PRILOSEC) 20 MG capsule Take 20 mg by mouth 2 (two) times daily before a meal.   rosuvastatin  (CRESTOR ) 20 MG tablet Take 1 tablet (20 mg total) by mouth daily.   Tiotropium Bromide (SPIRIVA  RESPIMAT) 2.5 MCG/ACT AERS Inhale 2 puffs into the lungs daily.    Immunization History  Administered Date(s) Administered   INFLUENZA, HIGH DOSE SEASONAL PF 06/23/2024   Influenza,inj,Quad PF,6+ Mos 08/08/2020   PFIZER Comirnaty(Gray Top)Covid-19 Tri-Sucrose Vaccine 11/05/2019, 11/26/2019        Objective:     Vitals:   08/30/24 1247  BP: 124/70  Pulse: 88  Temp: 98 F (  36.7 C)  Height: 5' 7 (1.702 m)  Weight: 176 lb (79.8 kg)  SpO2: 98%  TempSrc: Temporal  BMI (Calculated): 27.56     SpO2: 98 %  GENERAL: HEAD: Normocephalic, atraumatic.  EYES: Pupils equal, round, reactive to light.  No scleral icterus.  MOUTH:  NECK: Supple. No thyromegaly. Trachea midline. No JVD.  No adenopathy. PULMONARY: Good air entry bilaterally.  No adventitious sounds. CARDIOVASCULAR: S1 and S2. Regular rate and rhythm.  ABDOMEN: MUSCULOSKELETAL: No joint deformity, no clubbing, no edema.  NEUROLOGIC:  SKIN: Intact,warm,dry. PSYCH:        Assessment & Plan:     ICD-10-CM   1. Shortness of breath  R06.02     2. Myalgia  M79.10 Pulmonary function test    CBC  with Differential/Platelet    Sedimentation rate    C-reactive protein    Rheumatoid Factor    Antinuclear Antib (ANA)    CK    3. Arthralgia of shoulder, unspecified laterality  M25.519 Pulmonary function test    CBC with Differential/Platelet    Sedimentation rate    C-reactive protein    Rheumatoid Factor    Antinuclear Antib (ANA)    4. At risk for sleep apnea  Z91.89       Orders Placed This Encounter  Procedures   CBC with Differential/Platelet    Standing Status:   Future    Expected Date:   08/30/2024    Expiration Date:   08/30/2025   Sedimentation rate    Standing Status:   Future    Expiration Date:   08/30/2025   C-reactive protein    Standing Status:   Future    Expiration Date:   08/30/2025   Rheumatoid Factor    Standing Status:   Future    Expiration Date:   08/30/2025   Antinuclear Antib (ANA)    Standing Status:   Future    Expiration Date:   08/30/2025   CK    Standing Status:   Future    Expiration Date:   08/30/2025   Pulmonary function test    Standing Status:   Future    Expiration Date:   08/30/2025    Where should this test be performed?:   Outpatient Pulmonary    What type of PFT is being ordered?:   Full PFT    No orders of the defined types were placed in this encounter.    Advised if symptoms do not improve or worsen, to please contact office for sooner follow up or seek emergency care.    I spent xxx minutes of dedicated to the care of this patient on the date of this encounter to include pre-visit review of records, face-to-face time with the patient discussing conditions above, post visit ordering of testing, clinical documentation with the electronic health record, making appropriate referrals as documented, and communicating necessary findings to members of the patients care team.   C. Leita Sanders, MD Advanced Bronchoscopy PCCM [facility]    *This note was dictated using voice recognition software/Dragon.   Despite best efforts to proofread, errors can occur which can change the meaning. Any transcriptional errors that result from this process are unintentional and may not be fully corrected at the time of dictation.

## 2024-08-31 ENCOUNTER — Ambulatory Visit: Payer: Self-pay | Admitting: Pulmonary Disease

## 2024-08-31 LAB — ANA: Anti Nuclear Antibody (ANA): NEGATIVE

## 2024-08-31 LAB — RHEUMATOID FACTOR: Rheumatoid fact SerPl-aCnc: <10 [IU]/mL (ref <14.0)

## 2024-09-10 ENCOUNTER — Other Ambulatory Visit: Payer: Self-pay

## 2024-09-10 DIAGNOSIS — R0602 Shortness of breath: Secondary | ICD-10-CM

## 2024-09-16 ENCOUNTER — Telehealth: Payer: Self-pay

## 2024-09-16 ENCOUNTER — Ambulatory Visit: Payer: Self-pay

## 2024-09-16 NOTE — Telephone Encounter (Signed)
 Copied from CRM #[id number]. Topic: General - Other >> Sep 16, 2024 10:53 AM Antony RAMAN wrote: Reason for CRM: patient is having swelling of his right leg so bad that he can't see his kneecap, refused triage nurse. Please advise  [phone]

## 2024-09-16 NOTE — Telephone Encounter (Signed)
 Spoke with patient advised to go to urgent care or ER. No appointments available in office today. Patient understands that he should be seen sooner then next week.

## 2024-09-16 NOTE — Telephone Encounter (Signed)
 FYI Only or Action Required?: FYI only for provider: appointment scheduled on 09/19/24.  Patient was last seen in primary care on 08/04/2024 by Zafirov, Clarissa A, MD.  Called Nurse Triage reporting Leg Swelling.  Symptoms began several weeks ago.  Interventions attempted: Rest, hydration, or home remedies.  Symptoms are: gradually worsening.  Triage Disposition: See HCP Within 4 Hours (Or PCP Triage)  Patient/caregiver understands and will follow disposition?: No, refuses disposition Wanting to reschedule appt for 12/31 in office for sooner appt on 12/29 at [facility]. Declines UC today.    3 weeks ago swelling both legs from mid foot to thigh. Right knee with significant swelling, improves over night. 7/10 pain behind right knee. Swelling has gotten worse, was 3/10 when calling nurse triage on 11/25, now 8/10 with slight indentation. Denies hx of CHF, just htn. Noted to have started taking Crestor  07/14/24. Swelling started mid November with aches and pains in shoulders. BP 124/75. Denies CP, SOB, fever or confusion. No hx of blood clot or CHF. In June noted to have had injections for varicose veins in right leg. No office appts in pt region today, declines UC today. Was scheduled for soonest appt with PCP on 12/31 previously, rescheduled for 12/29 with different provider at home office to be seen sooner. Advised UC or ED for worsening symptoms, pt verbalizes understanding.     Copied from CRM #[id number]. Topic: Clinical - Red Word Triage >> Sep 16, 2024 10:51 AM Antony RAMAN wrote: Red Word that prompted transfer to Nurse Triage: right leg swelling so bad he can't see his kneecap Reason for Disposition  SEVERE leg swelling (e.g., swelling extends above knee, entire leg is swollen, weeping fluid)  Answer Assessment - Initial Assessment Questions 1. ONSET: When did the swelling start? (e.g., minutes, hours, days)     3 weeks ago  2. LOCATION: What part of the leg is swollen?  Are both  legs swollen or just one leg?     Both legs from mid foot to thigh, right knee the worst  3. SEVERITY: How bad is the swelling? (e.g., localized; mild, moderate, severe)     Severe  4. REDNESS: Is there redness or signs of infection?     Deneis  5. PAIN: Is the swelling painful to touch? If Yes, ask: How painful is it?   (Scale 1-10; mild, moderate or severe)     7/10 pain behind right knee  6. FEVER: Do you have a fever? If Yes, ask: What is it, how was it measured, and when did it start?      Denies  7. CAUSE: What do you think is causing the leg swelling?     New rx for crestor  on 10/23  8. MEDICAL HISTORY: Do you have a history of blood clots (e.g., DVT), cancer, heart failure, kidney disease, or liver failure?     Denies  9. RECURRENT SYMPTOM: Have you had leg swelling before? If Yes, ask: When was the last time? What happened that time?     Denies  10. OTHER SYMPTOMS: Do you have any other symptoms? (e.g., chest pain, difficulty breathing)       Denies  Protocols used: Leg Swelling and Edema-A-AH

## 2024-09-18 NOTE — Progress Notes (Unsigned)
 "  Subjective:    Patient ID: Keith Ramos, male    DOB: 13-Feb-1956, 68 y.o.   MRN: [id number]  HPI  Discussed the use of AI scribe software for clinical note transcription with the patient, who gave verbal consent to proceed.  Keith Ramos is a 68 year old male with varicose veins and arthritis who presents with leg swelling.  He has experienced significant swelling in his legs since December 1st, which worsened after returning from a cruise on December 7th. The swelling was particularly severe during the drive to and from [facility], [place] . After discontinuing rosuvastatin  for four days, he noticed some resolution in swelling, as evidenced by being able to see his kneecap for the first time this morning.  The swelling is more pronounced in the right leg, accompanied by pain behind the kneecap. He describes warmth in the legs and can feel his heart rate in his left ankle when reclining. No chest pain or shortness of breath is present, and there is no history of heart failure. He has varicose veins, treated with three series of injections at a vein clinic in June, and reports no pain in the treated areas.  He has been on amlodipine  for six years. His blood pressure readings at home have varied, with the highest being 157/82 and the lowest 119/73. 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       Review of Systems   Past Medical History:  Diagnosis Date   Anemia    Chronic kidney disease    stone stone   Epididymitis, right 07/30/2022   GERD (gastroesophageal reflux disease)    Hypertension    KNEE SPRAIN, MEDIAL COLLATERAL LIGAMENT 03/23/2007   Qualifier: Diagnosis of   By: FAUSTINO MD, JONATHAN         Malnutrition of moderate degree 03/24/2018    Current Outpatient Medications  Medication Sig Dispense Refill   amLODipine  (NORVASC ) 10 MG tablet Take 1 tablet (10 mg total) by mouth daily. 90 tablet 3   aspirin 81 MG chewable tablet Chew  81 mg by mouth daily.     budesonide -formoterol  (SYMBICORT ) 160-4.5 MCG/ACT inhaler Inhale 2 puffs into the lungs 2 (two) times daily. (Patient not taking: Reported on 08/30/2024) 10.2 g 3   lisinopril  (ZESTRIL ) 30 MG tablet Take 30 mg by mouth daily.     meloxicam (MOBIC) 15 MG tablet TAKE 1 TABLET BY MOUTH ONCE DAILY FOR 14DAYS     Multiple Vitamins-Minerals (MULTIVITAMIN ADULTS PO) Take by mouth.     omeprazole (PRILOSEC) 20 MG capsule Take 20 mg by mouth 2 (two) times daily before a meal.     rosuvastatin  (CRESTOR ) 20 MG tablet Take 1 tablet (20 mg total) by mouth daily. 90 tablet 3   Tiotropium Bromide (SPIRIVA  RESPIMAT) 2.5 MCG/ACT AERS Inhale 2 puffs into the lungs daily. 4 g 2   Tiotropium Bromide (SPIRIVA  RESPIMAT) 2.5 MCG/ACT AERS Inhale 2 puffs into the lungs daily. 8 g 0   No current facility-administered medications for this visit.    Allergies[1]  Family History  Problem Relation Age of Onset   Hepatitis B Father    Cirrhosis Father    Pancreatic cancer Maternal Grandmother    Colon cancer Maternal Grandfather 52   Colon cancer Cousin 38   Esophageal cancer Neg Hx    Stomach cancer Neg Hx    Colon polyps Neg Hx    Rectal cancer Neg Hx  Social History   Socioeconomic History   Marital status: Single    Spouse name: Not on file   Number of children: Not on file   Years of education: Not on file   Highest education level: Some college, no degree  Occupational History   Not on file  Tobacco Use   Smoking status: Never   Smokeless tobacco: Never  Vaping Use   Vaping status: Never Used  Substance and Sexual Activity   Alcohol use: Yes    Comment: occasionally   Drug use: No   Sexual activity: Not on file  Other Topics Concern   Not on file  Social History Narrative   Not on file   Social Drivers of Health   Tobacco Use: Medium Risk (09/06/2024)   Received from [facility]   Patient History    Smoking Tobacco Use: Former    Smokeless Tobacco  Use: Never    Passive Exposure: Not on file  Financial Resource Strain: Low Risk (07/10/2024)   Overall Financial Resource Strain (CARDIA)    Difficulty of Paying Living Expenses: Not hard at all  Food Insecurity: No Food Insecurity (07/10/2024)   Epic    Worried About Programme Researcher, Broadcasting/film/video in the Last Year: Never true    Ran Out of Food in the Last Year: Never true  Transportation Needs: No Transportation Needs (07/10/2024)   Epic    Lack of Transportation (Medical): No    Lack of Transportation (Non-Medical): No  Physical Activity: Insufficiently Active (07/10/2024)   Exercise Vital Sign    Days of Exercise per Week: 4 days    Minutes of Exercise per Session: 10 min  Stress: No Stress Concern Present (07/10/2024)   [employer] of Occupational Health - Occupational Stress Questionnaire    Feeling of Stress: Only a little  Recent Concern: Stress - Stress Concern Present (06/19/2024)   [employer] of Occupational Health - Occupational Stress Questionnaire    Feeling of Stress: To some extent  Social Connections: Moderately Integrated (06/19/2024)   Social Connection and Isolation Panel    Frequency of Communication with Friends and Family: Three times a week    Frequency of Social Gatherings with Friends and Family: More than three times a week    Attends Religious Services: More than 4 times per year    Active Member of [employer] or Organizations: No    Attends Banker Meetings: Not on file    Marital Status: Living with partner  Intimate Partner Violence: Not At Risk (06/15/2023)   Humiliation, Afraid, Rape, and Kick questionnaire    Fear of Current or Ex-Partner: No    Emotionally Abused: No    Physically Abused: No    Sexually Abused: No  Depression (PHQ2-9): Low Risk (08/04/2024)   Depression (PHQ2-9)    PHQ-2 Score: 1  Alcohol Screen: Low Risk (07/10/2024)   Alcohol Screen    Last Alcohol Screening Score (AUDIT): 3  Housing: Unknown (07/10/2024)   Epic     Unable to Pay for Housing in the Last Year: No    Number of Times Moved in the Last Year: Not on file    Homeless in the Last Year: No  Utilities: Not At Risk (06/15/2023)   AHC Utilities    Threatened with loss of utilities: No  Health Literacy: Not on file     Constitutional: Denies fever, malaise, fatigue, headache or abrupt weight changes.  HEENT: Denies eye pain, eye redness, ear pain, ringing in  the ears, wax buildup, runny nose, nasal congestion, bloody nose, or sore throat. Respiratory: Denies difficulty breathing, shortness of breath, cough or sputum production.   Cardiovascular: Pt reports swelling in legs, varicose veins. Denies chest pain, chest tightness, palpitations or swelling in the hands.  Gastrointestinal: Denies abdominal pain, bloating, constipation, diarrhea or blood in the stool.  GU: Denies urgency, frequency, pain with urination, burning sensation, blood in urine, odor or discharge. Musculoskeletal: Patient reports intermittent joint pain.  Denies decrease in range of motion, difficulty with gait, muscle pain or joint swelling.  Skin: Denies redness, rashes, lesions or ulcercations.  Neurological: Denies dizziness, difficulty with memory, difficulty with speech or problems with balance and coordination.  Psych: Denies anxiety, depression, SI/HI.  No other specific complaints in a complete review of systems (except as listed in HPI above).      Objective:   Physical Exam  BP 138/78   Pulse 90   Ht 5' 7 (1.702 m)   Wt 173 lb (78.5 kg)   SpO2 99%   BMI 27.10 kg/m   Wt Readings from Last 3 Encounters:  08/30/24 176 lb (79.8 kg)  08/08/24 174 lb 4 oz (79 kg)  08/04/24 174 lb (78.9 kg)    General: Appears his stated age, awake, in NAD. Skin: Warm, dry and intact. No redness, warmth or ulcerations noted. HEENT: Head: normal shape and size; Eyes: sclera white, no icterus, conjunctiva pink, PERRLA and EOMs intact; Cardiovascular: Normal rate and rhythm.  S1,S2 noted.  No murmur, rubs or gallops noted.  Varicose veins noted to BLE.  Minimal edema noted around the right ankle. Pulmonary/Chest: Normal effort and positive vesicular breath sounds. No respiratory distress. No wheezes, rales or ronchi noted.  Musculoskeletal: Joint enlargement noted of the right knee without effusion.  No fullness noted in the right popliteal space.  No difficulty with gait.  Neurological: Alert and oriented.    BMET    Component Value Date/Time   NA 140 06/23/2024 0945   K 4.8 06/23/2024 0945   CL 103 06/23/2024 0945   CO2 28 06/23/2024 0945   GLUCOSE 104 (H) 06/23/2024 0945   BUN 9 06/23/2024 0945   CREATININE 0.73 06/23/2024 0945   CALCIUM  9.7 06/23/2024 0945   GFRNONAA >60 06/18/2023 0600   GFRAA >60 03/23/2018 0254    Lipid Panel     Component Value Date/Time   CHOL 207 (H) 06/23/2024 0945   TRIG 100 06/23/2024 0945   HDL 49 06/23/2024 0945   CHOLHDL 4.2 06/23/2024 0945   VLDL 20 04/28/2007 2136   LDLCALC 137 (H) 06/23/2024 0945    CBC    Component Value Date/Time   WBC 5.6 08/30/2024 1353   RBC 4.21 (L) 08/30/2024 1353   HGB 12.8 (L) 08/30/2024 1353   HCT 36.5 (L) 08/30/2024 1353   PLT 231 08/30/2024 1353   MCV 86.7 08/30/2024 1353   MCH 30.4 08/30/2024 1353   MCHC 35.1 08/30/2024 1353   RDW 12.7 08/30/2024 1353   LYMPHSABS 1.7 08/30/2024 1353   MONOABS 0.6 08/30/2024 1353   EOSABS 0.1 08/30/2024 1353   BASOSABS 0.0 08/30/2024 1353    Hgb A1C Lab Results  Component Value Date   HGBA1C 5.5 06/23/2024            Assessment & Plan:  Assessment and Plan    Peripheral edema Moderate swelling in lower extremities, primarily right leg. Improved after discontinuing rosuvastatin . Differential includes medication side effects and possible Baker cyst. No DVT signs. -  Continue to hold rosuvastatin  and discuss with Doctor Z at next visit. - Consider amlodipine  as cause if swelling recurs and discuss switching  medication.   Unilateral primary osteoarthritis of right knee Chronic osteoarthritis in right knee with MCL tenderness. Possible contribution to swelling and pain. - Okay to take Tylenol  arthritis OTC  Varicose veins of lower extremities Presence of varicose veins, previously treated with injections.  Essential hypertension Blood pressure well-controlled at home, elevated in office likely due to white coat syndrome. Amlodipine  may contribute to edema. - Continue monitoring blood pressure at home. -Continue amlodipine  10 mg and lisinopril  30 mg - Discuss potential switch of blood pressure medication if edema recurs.  Hyperlipidemia Concerned about side effect of swelling from this medication -He has discontinued this for now with improvement in symptoms -He would like to discuss with PCP about resuming at a lower dose or alternatively changing medication       Followup with your PCP as previously scheduled Angeline Laura, NP     [1] No Known Allergies  "

## 2024-09-19 ENCOUNTER — Encounter: Payer: Self-pay | Admitting: Internal Medicine

## 2024-09-19 ENCOUNTER — Ambulatory Visit (INDEPENDENT_AMBULATORY_CARE_PROVIDER_SITE_OTHER): Admitting: Internal Medicine

## 2024-09-19 VITALS — BP 138/78 | HR 90 | Ht 67.0 in | Wt 173.0 lb

## 2024-09-19 DIAGNOSIS — E782 Mixed hyperlipidemia: Secondary | ICD-10-CM | POA: Diagnosis not present

## 2024-09-19 DIAGNOSIS — R600 Localized edema: Secondary | ICD-10-CM | POA: Diagnosis not present

## 2024-09-19 DIAGNOSIS — M1993 Secondary osteoarthritis, unspecified site: Secondary | ICD-10-CM

## 2024-09-19 DIAGNOSIS — I8310 Varicose veins of unspecified lower extremity with inflammation: Secondary | ICD-10-CM | POA: Diagnosis not present

## 2024-09-19 DIAGNOSIS — I10 Essential (primary) hypertension: Secondary | ICD-10-CM

## 2024-09-19 NOTE — Patient Instructions (Signed)
 Peripheral Edema  Peripheral edema is swelling that is caused by a buildup of fluid. Peripheral edema most often affects the lower legs, ankles, and feet. It can also develop in the arms, hands, and face. The area of the body that has peripheral edema will look swollen. It may also feel heavy or warm. Your clothes may start to feel tight. Pressing on the area may make a temporary dent in your skin (pitting edema). You may not be able to move your swollen arm or leg as much as usual. There are many causes of peripheral edema. It can happen because of a complication of other conditions such as heart failure, kidney disease, or a problem with your circulation. It also can be a side effect of certain medicines or happen because of an infection. It often happens to women during pregnancy. Sometimes, the cause is not known. Follow these instructions at home: Managing pain, stiffness, and swelling  Raise (elevate) your legs while you are sitting or lying down. Move around often to prevent stiffness and to reduce swelling. Do not sit or stand for long periods of time. Do not wear tight clothing. Do not wear garters on your upper legs. Exercise your legs to get your circulation going. This helps to move the fluid back into your blood vessels, and it may help the swelling go down. Wear compression stockings as told by your health care provider. These stockings help to prevent blood clots and reduce swelling in your legs. It is important that these are the correct size. These stockings should be prescribed by your doctor to prevent possible injuries. If elastic bandages or wraps are recommended, use them as told by your health care provider. Medicines Take over-the-counter and prescription medicines only as told by your health care provider. Your health care provider may prescribe medicine to help your body get rid of excess water (diuretic). Take this medicine if you are told to take it. General  instructions Eat a low-salt (low-sodium) diet as told by your health care provider. Sometimes, eating less salt may reduce swelling. Pay attention to any changes in your symptoms. Moisturize your skin daily to help prevent skin from cracking and draining. Keep all follow-up visits. This is important. Contact a health care provider if: You have a fever. You have swelling in only one leg. You have increased swelling, redness, or pain in one or both of your legs. You have drainage or sores at the area where you have edema. Get help right away if: You have edema that starts suddenly or is getting worse, especially if you are pregnant or have a medical condition. You develop shortness of breath, especially when you are lying down. You have pain in your chest or abdomen. You feel weak. You feel like you will faint. These symptoms may be an emergency. Get help right away. Call 911. Do not wait to see if the symptoms will go away. Do not drive yourself to the hospital. Summary Peripheral edema is swelling that is caused by a buildup of fluid. Peripheral edema most often affects the lower legs, ankles, and feet. Move around often to prevent stiffness and to reduce swelling. Do not sit or stand for long periods of time. Pay attention to any changes in your symptoms. Contact a health care provider if you have edema that starts suddenly or is getting worse, especially if you are pregnant or have a medical condition. Get help right away if you develop shortness of breath, especially when lying down.  This information is not intended to replace advice given to you by your health care provider. Make sure you discuss any questions you have with your health care provider. Document Revised: 05/13/2021 Document Reviewed: 05/13/2021 Elsevier Patient Education  2024 [employer].

## 2024-09-21 ENCOUNTER — Ambulatory Visit

## 2024-09-26 ENCOUNTER — Ambulatory Visit

## 2024-09-26 VITALS — BP 158/84 | HR 86 | Ht 67.0 in | Wt 175.0 lb

## 2024-09-26 DIAGNOSIS — M25469 Effusion, unspecified knee: Secondary | ICD-10-CM | POA: Insufficient documentation

## 2024-09-26 DIAGNOSIS — J449 Chronic obstructive pulmonary disease, unspecified: Secondary | ICD-10-CM | POA: Insufficient documentation

## 2024-09-26 DIAGNOSIS — R5383 Other fatigue: Secondary | ICD-10-CM | POA: Diagnosis not present

## 2024-09-26 DIAGNOSIS — I25118 Atherosclerotic heart disease of native coronary artery with other forms of angina pectoris: Secondary | ICD-10-CM | POA: Diagnosis not present

## 2024-09-26 MED ORDER — EZETIMIBE 10 MG PO TABS: 10.0000 mg | ORAL_TABLET | Freq: Every day | ORAL | 3 refills | Status: AC

## 2024-09-26 MED ORDER — ROSUVASTATIN CALCIUM 10 MG PO TABS: 10.0000 mg | ORAL_TABLET | Freq: Every day | ORAL | 3 refills | Status: AC

## 2024-09-26 NOTE — Progress Notes (Signed)
 "           Progress Note  Physician: Parris DELENA Juneau, MD   HPI: Keith Ramos is a 69 y.o. male presenting on 09/26/2024 for Medical Management of Chronic Issues .  Discussed the use of AI scribe software for clinical note transcription with the patient, who gave verbal consent to proceed.  History of Present Illness   Keith Ramos is a 69 year old male who presents with  Cardiovascular symptoms/Hypertension - Heart rate remains in the 80s. - Home blood pressure readings typically range from 120s to mid-130s systolic and 60s to 70s diastolic.  Lower extremity edema - Evening swelling around the medial collateral ligament (MCL) area of the knee, making the kneecap difficult to visualize. - Swelling is less severe now but still occurs late in the day.  - Crestor  dc/c'd due to edema and myalgias  Respiratory symptoms - Likely COPD.  Pulmonology has seen - Currently using Spiriva  and albuterol as a rescue inhaler. - Improved breathing with no shortness of breath or chest tightness. - Awaiting results from a sleep study and has a pulmonary function test scheduled.  - Increased activity level since no longer experiencing shortness of breath.         Medical history:  Relevant past medical, surgical, family and social history reviewed and updated as indicated. Interim medical history since our last visit reviewed.  Allergies and medications reviewed and updated.   ROS: Negative unless specifically indicated above in HPI.   Current Medications[1]       Objective:     BP (!) 158/84 (BP Location: Left Arm, Patient Position: Sitting, Cuff Size: Normal)   Pulse 86   Ht 5' 7 (1.702 m)   Wt 175 lb (79.4 kg)   SpO2 98%   BMI 27.41 kg/m   Wt Readings from Last 3 Encounters:  09/26/24 175 lb (79.4 kg)  09/19/24 173 lb (78.5 kg)  08/30/24 176 lb (79.8 kg)    Physical Exam  Physical Exam Vitals reviewed.  Constitutional:      Appearance: Normal appearance.  Well-developed with normal weight.  Cardiovascular:     Rate and Rhythm: Normal rate and regular rhythm. Normal heart sounds. Normal peripheral pulses Pulmonary:     Normal breath sounds with normal effort Skin:    General: Skin is warm and dry without noticeable rash. Neurological:     General: No focal deficit present.  Psychiatric:        Mood and Affect: Mood, behavior and cognition normal      Assessment & Plan:   Encounter Diagnoses  Name Primary?   Chronic obstructive pulmonary disease, unspecified COPD type (HCC) Yes   Coronary artery disease of native artery of native heart with stable angina pectoris    Knee swelling    Other fatigue     Orders Placed This Encounter  Procedures   Home sleep test     Assessment and Plan  #1 knee swelling-Patient likely has some degree of osteoarthritis and this may be a contributing factor particularly since his activity level is increased.  Secondary to that we may see this to some degree with statins especially in the setting of myalgias.  He has felt that his swelling has improved since he has discontinued the Crestor .  If we continue to have swelling consider x-ray.  Discrete swelling due to statin intake would be less likely  2.  Likely COPD diagnosis.  We are waiting on pulmonary function testing  and he will have a follow-up with pulmonology.  Continue with Spiriva , albuterol use as needed  3.  Hypertension.  Stable blood pressure in good range continue with lisinopril  30 mg tablets daily and amlodipine  10 mg tablets daily  4.  Coronary artery disease.  Patient seen by cardiology.  Medical management with a LDL goal of 55.  Continue with daily aspirin.  We will plan to recheck statins in in April May.  Will trial Zetia  10 mg with rosuvastatin  10 mg.  Follow-up otherwise as needed                    [1]  Current Outpatient Medications:    amLODipine  (NORVASC ) 10 MG tablet, Take 1 tablet (10 mg total) by mouth  daily., Disp: 90 tablet, Rfl: 3   aspirin 81 MG chewable tablet, Chew 81 mg by mouth daily., Disp: , Rfl:    lisinopril  (ZESTRIL ) 30 MG tablet, Take 30 mg by mouth daily., Disp: , Rfl:    Multiple Vitamins-Minerals (MULTIVITAMIN ADULTS PO), Take by mouth., Disp: , Rfl:    omeprazole (PRILOSEC) 20 MG capsule, Take 20 mg by mouth 2 (two) times daily before a meal., Disp: , Rfl:    Tiotropium Bromide (SPIRIVA  RESPIMAT) 2.5 MCG/ACT AERS, Inhale 2 puffs into the lungs daily., Disp: 4 g, Rfl: 2   triamcinolone ointment (KENALOG) 0.1 %, Apply topically., Disp: , Rfl:    rosuvastatin  (CRESTOR ) 20 MG tablet, Take 1 tablet (20 mg total) by mouth daily. (Patient not taking: Reported on 09/19/2024), Disp: 90 tablet, Rfl: 3  "

## 2024-10-15 ENCOUNTER — Encounter: Payer: Self-pay | Admitting: Nurse Practitioner

## 2024-10-15 ENCOUNTER — Telehealth: Admitting: Nurse Practitioner

## 2024-10-15 DIAGNOSIS — R399 Unspecified symptoms and signs involving the genitourinary system: Secondary | ICD-10-CM

## 2024-10-15 NOTE — Progress Notes (Signed)
" °  Because we do not treat male UTIs, suspected, I feel your condition warrants further evaluation and I recommend that you be seen in a face-to-face visit.   NOTE: There will be NO CHARGE for this E-Visit   If you are having a true medical emergency, please call 911.     For an urgent face to face visit, [facility] has multiple urgent care centers for your convenience.  Click the link below for the full list of locations and hours, walk-in wait times, appointment scheduling options and driving directions:  Urgent Care - [place], [place], [place], [place], [place], [place]  [place]     Your MyChart E-visit questionnaire answers were reviewed by a board certified advanced clinical practitioner to complete your personal care plan based on your specific symptoms.    Thank you for using e-Visits.    "

## 2024-10-20 ENCOUNTER — Ambulatory Visit: Admitting: Pulmonary Disease

## 2024-10-20 ENCOUNTER — Encounter: Payer: Self-pay | Admitting: Pulmonary Disease

## 2024-10-20 ENCOUNTER — Ambulatory Visit

## 2024-10-20 VITALS — BP 136/88 | HR 88 | Temp 97.3°F | Ht 67.0 in | Wt 174.6 lb

## 2024-10-20 DIAGNOSIS — R600 Localized edema: Secondary | ICD-10-CM | POA: Diagnosis not present

## 2024-10-20 DIAGNOSIS — M25519 Pain in unspecified shoulder: Secondary | ICD-10-CM

## 2024-10-20 DIAGNOSIS — M791 Myalgia, unspecified site: Secondary | ICD-10-CM

## 2024-10-20 DIAGNOSIS — R0602 Shortness of breath: Secondary | ICD-10-CM | POA: Diagnosis not present

## 2024-10-20 DIAGNOSIS — J439 Emphysema, unspecified: Secondary | ICD-10-CM | POA: Diagnosis not present

## 2024-10-20 DIAGNOSIS — J438 Other emphysema: Secondary | ICD-10-CM

## 2024-10-20 LAB — PULMONARY FUNCTION TEST
DL/VA % pred: 105 %
DL/VA: 4.33 ml/min/mmHg/L
DLCO cor % pred: 116 %
DLCO cor: 27.51 ml/min/mmHg
DLCO unc % pred: 109 %
DLCO unc: 26.01 ml/min/mmHg
FEF 25-75 Post: 2.83 L/s
FEF 25-75 Pre: 2.87 L/s
FEF2575-%Change-Post: -1 %
FEF2575-%Pred-Post: 122 %
FEF2575-%Pred-Pre: 124 %
FEV1-%Change-Post: 0 %
FEV1-%Pred-Post: 105 %
FEV1-%Pred-Pre: 104 %
FEV1-Post: 3.12 L
FEV1-Pre: 3.1 L
FEV1FVC-%Change-Post: 1 %
FEV1FVC-%Pred-Pre: 106 %
FEV6-%Change-Post: -1 %
FEV6-%Pred-Post: 102 %
FEV6-%Pred-Pre: 103 %
FEV6-Post: 3.85 L
FEV6-Pre: 3.9 L
FEV6FVC-%Change-Post: 0 %
FEV6FVC-%Pred-Post: 105 %
FEV6FVC-%Pred-Pre: 105 %
FVC-%Change-Post: 0 %
FVC-%Pred-Post: 96 %
FVC-%Pred-Pre: 97 %
FVC-Post: 3.88 L
FVC-Pre: 3.92 L
Post FEV1/FVC ratio: 80 %
Post FEV6/FVC ratio: 99 %
Pre FEV1/FVC ratio: 79 %
Pre FEV6/FVC Ratio: 99 %
RV % pred: 106 %
RV: 2.38 L
TLC % pred: 101 %
TLC: 6.52 L

## 2024-10-20 NOTE — Progress Notes (Signed)
 Full PFT completed today ? ?

## 2024-10-20 NOTE — Patient Instructions (Signed)
 VISIT SUMMARY:  During your follow-up visit, we discussed your improved breathing, leg swelling, and overall health. Your shortness of breath has improved, and your lung function tests are normal. We also addressed the swelling in your leg, foot, and ankle, which may be related to your medication and venous insufficiency.  YOUR PLAN:  -DYSPNEA WITH MINIMAL EMPHYSEMA: Dyspnea means difficulty breathing. Your shortness of breath has improved, and your lung function tests are normal. A CT scan shows minimal emphysema, and your previous severe bronchitis may have contributed to lingering symptoms. We will continue with your current management as your symptoms have improved.  -LOWER EXTREMITY EDEMA: Lower extremity edema means swelling in the lower legs, feet, and ankles. This swelling may be related to your medication, amlodipine , and venous insufficiency. Your blood pressure remains stable off amlodipine . We recommend continuing to hold amlodipine  until you have a cardiology evaluation. We will contact your cardiologist to discuss your symptoms and the possibility of switching to a mild diuretic. Please continue to monitor your blood pressure regularly.  INSTRUCTIONS:  Please follow up with your cardiologist to discuss your symptoms and the potential switch to a mild diuretic. Continue to monitor your blood pressure regularly and report any significant changes.

## 2024-10-20 NOTE — Patient Instructions (Signed)
 Full PFT completed today ? ?

## 2024-10-20 NOTE — Progress Notes (Signed)
 "  Subjective:    Patient ID: Keith Ramos, male    DOB: [birth year], 69 y.o.   MRN: [id number]  Patient Care Team: Edman Marsa PARAS, DO as PCP - General [facility])  Chief Complaint  Patient presents with   Shortness of Breath    Shortness of breath on exertion. No cough or wheezing.     BACKGROUND/INTERVAL:Patient is a 69 year old non-smoker who presents for follow-up of shortness of breath.  We initially saw him on 30 August 2024.  He had PFTs today.  HPI Discussed the use of AI scribe software for clinical note transcription with the patient, who gave verbal consent to proceed.  History of Present Illness   Keith Ramos is a 69 year old male who presents for follow-up on dyspnea.  He no longer experiences shortness of breath as he did during the summer. The use of Spiriva  may have contributed to this improvement, although he is uncertain. He recalls having a severe case of bronchitis about a year ago, which he believes might have left some lingering effects.  He has a history of heart issues, including blockages, with a cardiologist previously confirming that his heart mechanics are good. He is currently on medication for cholesterol management and is awaiting lab work to assess his cholesterol levels.  He experiences swelling in his leg, particularly in the foot and ankle, which was severe enough to prevent him from wearing a shoe. He has been on lisinopril  and amlodipine  for six years and suspects the swelling might be related to amlodipine . He has stopped taking amlodipine  for three days and monitors his blood pressure three times a day, noting no significant changes in blood pressure but persistent swelling. He underwent vein injections in June and July, which resulted in bruising that is still present.  He mentions a family history of cystic fibrosis, with a nephew who has the condition and is now in his early 25s.      DATA 06/29/2024 echocardiogram: LVEF 60 to  65%, LV without regional abnormalities.  RV function normal.  RV size normal.  Trivial mitral valve regurgitation.  No aortic valve abnormalities. 07/04/2024 coronary CT: CAD RADS 3 moderate stenosis of left circumflex and D1.  Noncardiac findings: No mediastinal adenopathy or mass.  Unremarkable esophagus.  Lungs with very mild emphysema and central bronchial thickening, no focal airspace disease.  No pulmonary nodules. 07/05/2024 CT chest without contrast: No acute intrathoracic abnormality.  Severe three-vessel coronary artery atherosclerotic calcifications.  Severe atherosclerotic calcifications of the LEFT subclavian artery.  MINIMAL paraseptal emphysema with no suspicious pulmonary nodules or masses. 10/20/2024 PFTs: FEV1 3.10 L or 104% predicted, FVC 3.92 L or 97% predicted, FEV1/FVC 79%, no bronchodilator response.  Lung volumes normal diffusion capacity normal.  Normal study.  Review of Systems A 10 point review of systems was performed and it is as noted above otherwise negative.   Patient Active Problem List   Diagnosis Date Noted   COPD (chronic obstructive pulmonary disease) (HCC) 09/26/2024   Knee swelling 09/26/2024   PAD (peripheral artery disease) 08/07/2024   Carotid artery stenosis 08/07/2024   Coronary artery disease 07/14/2024   Subclavian arterial stenosis 07/14/2024   Hyperlipidemia 07/14/2024   Osteoarthritis 06/23/2024   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   Essential hypertension 07/30/2022   BPH (benign prostatic hyperplasia) 07/30/2022   Sinus tachycardia 07/29/2022   COLONIC POLYPS 05/26/2007   DIVERTICULOSIS, COLON W/O HEM 05/26/2007   REFLUX, ESOPHAGEAL 04/28/2007    Social History  Tobacco Use   Smoking status: Never   Smokeless tobacco: Never  Substance Use Topics   Alcohol use: Yes    Comment: occasionally    Allergies[1]  Active Medications[2]  Immunization History  Administered Date(s) Administered   INFLUENZA, HIGH DOSE SEASONAL  PF 06/23/2024   Influenza,inj,Quad PF,6+ Mos 08/08/2020   PFIZER Comirnaty(Gray Top)Covid-19 Tri-Sucrose Vaccine 11/05/2019, 11/26/2019        Objective:     Vitals:   10/20/24 1556  BP: 136/88  Pulse: 88  Temp: (!) 97.3 F (36.3 C)  Height: 5' 7 (1.702 m)  Weight: 174 lb 9.6 oz (79.2 kg)  SpO2: 100%  TempSrc: Temporal  BMI (Calculated): 27.34     SpO2: 100 %  GENERAL: Well-developed, fit appearing gentleman, no acute distress.  Fully ambulatory, no conversational dyspnea. HEAD: Normocephalic, atraumatic.  EYES: Pupils equal, round, reactive to light.  No scleral icterus.  MOUTH: Dentures both, oral mucosa moist, no thrush.   NECK: Supple. No thyromegaly. Trachea midline. No JVD.  No adenopathy. PULMONARY: Good air entry bilaterally.  No adventitious sounds. CARDIOVASCULAR: S1 and S2. Regular rate and rhythm.  No rubs, murmurs or gallops heard. ABDOMEN: Benign. MUSCULOSKELETAL: No joint deformity, no clubbing, no edema.  NEUROLOGIC: No overt focal deficit, no gait disturbance, speech is fluent. SKIN: Intact,warm,dry. PSYCH: Mood and behavior normal.   Recent Results (from the past 2160 hours)  Nitric oxide      Status: None   Collection Time: 08/30/24  1:10 PM  Result Value Ref Range   Nitric Oxide  21   CK     Status: None   Collection Time: 08/30/24  1:53 PM  Result Value Ref Range   Total CK 166 49 - 397 U/L    Comment: Performed at [facility], [address] [place], [place] [address]  Antinuclear Antib (ANA)     Status: None   Collection Time: 08/30/24  1:53 PM  Result Value Ref Range   Anti Nuclear Antibody (ANA) Negative Negative    Comment: (NOTE) Performed At: [facility] [place] [address] [place], [place] [id number] Jennette Shorter MD Ey:[id number]   Rheumatoid Factor     Status: None   Collection Time: 08/30/24  1:53 PM  Result Value Ref Range   Rheumatoid fact SerPl-aCnc <10.0 <14.0 IU/mL    Comment: (NOTE) Performed At: [facility]  [facility] [address] [place], [place] [id number] Jennette Shorter MD Ey:[id number]   Sedimentation rate     Status: None   Collection Time: 08/30/24  1:53 PM  Result Value Ref Range   Sed Rate 9 0 - 20 mm/hr    Comment: Performed at [facility], [address] [place], [place] [address]  CBC with Differential/Platelet     Status: Abnormal   Collection Time: 08/30/24  1:53 PM  Result Value Ref Range   WBC 5.6 4.0 - 10.5 K/uL   RBC 4.21 (L) 4.22 - 5.81 MIL/uL   Hemoglobin 12.8 (L) 13.0 - 17.0 g/dL   HCT 63.4 (L) 60.9 - 47.9 %   MCV 86.7 80.0 - 100.0 fL   MCH 30.4 26.0 - 34.0 pg   MCHC 35.1 30.0 - 36.0 g/dL   RDW 87.2 88.4 - 84.4 %   Platelets 231 150 - 400 K/uL   nRBC 0.0 0.0 - 0.2 %   Neutrophils Relative % 56 %   Neutro Abs 3.1 1.7 - 7.7 K/uL   Lymphocytes Relative 30 %   Lymphs Abs 1.7 0.7 - 4.0  K/uL   Monocytes Relative 11 %   Monocytes Absolute 0.6 0.1 - 1.0 K/uL   Eosinophils Relative 2 %   Eosinophils Absolute 0.1 0.0 - 0.5 K/uL   Basophils Relative 1 %   Basophils Absolute 0.0 0.0 - 0.1 K/uL   Immature Granulocytes 0 %   Abs Immature Granulocytes 0.02 0.00 - 0.07 K/uL    Comment: Performed at [facility], [address] [place], [place] [address]  C-reactive protein     Status: Abnormal   Collection Time: 08/30/24  1:54 PM  Result Value Ref Range   CRP 1.2 (H) <1.0 mg/dL    Comment: Performed at [facility], [address], [place], [place] [address]  Pulmonary function test     Status: None   Collection Time: 10/20/24  1:46 PM  Result Value Ref Range   FVC-Pre 3.92 L   FVC-%Pred-Pre 97 %   FVC-Post 3.88 L   FVC-%Pred-Post 96 %   FVC-%Change-Post 0 %   FEV1-Pre 3.10 L   FEV1-%Pred-Pre 104 %   FEV1-Post 3.12 L   FEV1-%Pred-Post 105 %   FEV1-%Change-Post 0 %   FEV6-Pre 3.90 L   FEV6-%Pred-Pre 103 %   FEV6-Post 3.85 L   FEV6-%Pred-Post 102 %   FEV6-%Change-Post -1 %   Pre FEV1/FVC ratio 79 %   FEV1FVC-%Pred-Pre 106  %   Post FEV1/FVC ratio 80 %   FEV1FVC-%Change-Post 1 %   Pre FEV6/FVC Ratio 99 %   FEV6FVC-%Pred-Pre 105 %   Post FEV6/FVC ratio 99 %   FEV6FVC-%Pred-Post 105 %   FEV6FVC-%Change-Post 0 %   FEF 25-75 Pre 2.87 L/sec   FEF2575-%Pred-Pre 124 %   FEF 25-75 Post 2.83 L/sec   FEF2575-%Pred-Post 122 %   FEF2575-%Change-Post -1 %   RV 2.38 L   RV % pred 106 %   TLC 6.52 L   TLC % pred 101 %   DLCO unc 26.01 ml/min/mmHg   DLCO unc % pred 109 %   DLCO cor 27.51 ml/min/mmHg   DLCO cor % pred 116 %   DL/VA 5.66 ml/min/mmHg/L   DL/VA % pred 894 %  *Discussed PFTs with patient.        Assessment & Plan:     ICD-10-CM   1. Shortness of breath  R06.02    Normal PFT's Resolved    2. Paraseptal emphysema (HCC) - MINIMAL  J43.8     3. Lower extremity edema  R60.0      Discussion:    Dyspnea evaluation with minimal emphysema Dyspnea has improved with no current shortness of breath. Lung function tests are normal. CT scan shows minimal emphysema. Previous severe bronchitis may have contributed to lingering symptoms. Post-viral syndrome is a possibility. Cardiac evaluation indicates good heart function with some blockages, likely genetic. - Continue current management as symptoms have improved.  Lower extremity edema likely secondary to amlodipine  and venous insufficiency Swelling in the leg, foot, and ankle, possibly exacerbated by amlodipine . Venous insufficiency may be contributing. Blood pressure remains stable off amlodipine . Previous vein injections in June and July may still be causing bruising. - Continue to hold amlodipine  until cardiology evaluation. - Contact cardiologist to discuss symptoms and potential switch to a mild diuretic. - Monitor blood pressure regularly.      Advised if symptoms do not improve or worsen, to please contact office for sooner follow up or seek emergency care.    I spent 33 minutes of dedicated to the care of this  patient on the date of this  encounter to include pre-visit review of records, face-to-face time with the patient discussing conditions above, post visit ordering of testing, clinical documentation with the electronic health record, making appropriate referrals as documented, and communicating necessary findings to members of the patients care team.     C. Leita Sanders, MD [facility]    *This note was generated using voice recognition software/Dragon and/or AI transcription program.  Despite best efforts to proofread, errors can occur which can change the meaning. Any transcriptional errors that result from this process are unintentional and may not be fully corrected at the time of dictation.     [1] No Known Allergies [2]  Current Meds  Medication Sig   amLODipine  (NORVASC ) 10 MG tablet Take 1 tablet (10 mg total) by mouth daily.   aspirin 81 MG chewable tablet Chew 81 mg by mouth daily.   ezetimibe  (ZETIA ) 10 MG tablet Take 1 tablet (10 mg total) by mouth daily.   lisinopril  (ZESTRIL ) 30 MG tablet Take 30 mg by mouth daily.   Multiple Vitamins-Minerals (MULTIVITAMIN ADULTS PO) Take by mouth.   omeprazole (PRILOSEC) 20 MG capsule Take 20 mg by mouth 2 (two) times daily before a meal.   rosuvastatin  (CRESTOR ) 10 MG tablet Take 1 tablet (10 mg total) by mouth daily.   Tiotropium Bromide (SPIRIVA  RESPIMAT) 2.5 MCG/ACT AERS Inhale 2 puffs into the lungs daily.   triamcinolone ointment (KENALOG) 0.1 % Apply topically.   "

## 2024-11-01 ENCOUNTER — Ambulatory Visit: Admitting: Family Medicine

## 2025-01-16 ENCOUNTER — Other Ambulatory Visit

## 2025-01-19 ENCOUNTER — Ambulatory Visit
